# Patient Record
Sex: Male | Born: 1990 | Race: White | Hispanic: No | Marital: Married | Smoking: Never smoker
Health system: Southern US, Community
[De-identification: ages and names within clinical notes are randomized; demographics above are authoritative.]

## PROBLEM LIST (undated history)

## (undated) DIAGNOSIS — F909 Attention-deficit hyperactivity disorder, unspecified type: Secondary | ICD-10-CM

## (undated) HISTORY — PX: TYMPANOPLASTY: SHX33

## (undated) HISTORY — DX: Attention-deficit hyperactivity disorder, unspecified type: F90.9

---

## 2017-04-30 ENCOUNTER — Ambulatory Visit (INDEPENDENT_AMBULATORY_CARE_PROVIDER_SITE_OTHER): Payer: 59 | Admitting: Medical

## 2017-04-30 ENCOUNTER — Encounter: Payer: Self-pay | Admitting: Medical

## 2017-04-30 VITALS — BP 120/84 | HR 73 | Temp 98.3°F | Resp 16 | Ht 70.0 in | Wt 174.6 lb

## 2017-04-30 DIAGNOSIS — Z113 Encounter for screening for infections with a predominantly sexual mode of transmission: Secondary | ICD-10-CM | POA: Diagnosis not present

## 2017-04-30 DIAGNOSIS — Z0000 Encounter for general adult medical examination without abnormal findings: Secondary | ICD-10-CM

## 2017-04-30 DIAGNOSIS — Z0184 Encounter for antibody response examination: Secondary | ICD-10-CM

## 2017-04-30 LAB — POC URINALSYSI DIPSTICK (AUTOMATED)
BILIRUBIN UA: NEGATIVE
Blood, UA: NEGATIVE
Glucose, UA: NEGATIVE
Ketones, UA: NEGATIVE
LEUKOCYTES UA: NEGATIVE
NITRITE UA: NEGATIVE
PH UA: 6 (ref 5.0–8.0)
PROTEIN UA: NEGATIVE
Spec Grav, UA: >=1.03 — AB (ref 1.010–1.025)
UROBILINOGEN UA: NEGATIVE U/dL — AB

## 2017-04-30 LAB — COMPREHENSIVE METABOLIC PANEL
ALK PHOS: 60 U/L (ref 39–117)
ALT: 22 U/L (ref 0–53)
AST: 15 U/L (ref 0–37)
Albumin: 5 g/dL (ref 3.5–5.2)
BUN: 14 mg/dL (ref 6–23)
CO2: 30 mEq/L (ref 19–32)
Calcium: 9.8 mg/dL (ref 8.4–10.5)
Chloride: 104 mEq/L (ref 96–112)
Creatinine, Ser: 0.92 mg/dL (ref 0.40–1.50)
GFR: 105.65 mL/min (ref >60.00)
GLUCOSE: 103 mg/dL — AB (ref 70–99)
POTASSIUM: 3.7 meq/L (ref 3.5–5.1)
Sodium: 142 mEq/L (ref 135–145)
TOTAL PROTEIN: 7.5 g/dL (ref 6.0–8.3)
Total Bilirubin: 0.7 mg/dL (ref 0.2–1.2)

## 2017-04-30 LAB — CBC WITH DIFFERENTIAL/PLATELET
Basophils Absolute: 0 10*3/uL (ref 0.0–0.1)
Basophils Relative: 0.5 % (ref 0.0–3.0)
EOS PCT: 1 % (ref 0.0–5.0)
Eosinophils Absolute: 0.1 10*3/uL (ref 0.0–0.7)
HCT: 48.7 % (ref 39.0–52.0)
Hemoglobin: 16.4 g/dL (ref 13.0–17.0)
LYMPHS ABS: 1.6 10*3/uL (ref 0.7–4.0)
Lymphocytes Relative: 27.7 % (ref 12.0–46.0)
MCHC: 33.8 g/dL (ref 30.0–36.0)
MCV: 89.2 fl (ref 78.0–100.0)
MONOS PCT: 8.3 % (ref 3.0–12.0)
Monocytes Absolute: 0.5 10*3/uL (ref 0.1–1.0)
NEUTROS PCT: 62.5 % (ref 43.0–77.0)
Neutro Abs: 3.6 10*3/uL (ref 1.4–7.7)
Platelets: 322 10*3/uL (ref 150.0–400.0)
RBC: 5.46 Mil/uL (ref 4.22–5.81)
RDW: 13.1 % (ref 11.5–15.5)
WBC: 5.8 10*3/uL (ref 4.0–10.5)

## 2017-04-30 LAB — LIPID PANEL
CHOLESTEROL: 205 mg/dL — AB (ref 0–200)
HDL: 45.7 mg/dL (ref >39.00)
LDL Cholesterol: 144 mg/dL — ABNORMAL HIGH (ref 0–99)
NonHDL: 159.52
TRIGLYCERIDES: 79 mg/dL (ref 0.0–149.0)
Total CHOL/HDL Ratio: 4
VLDL: 15.8 mg/dL (ref 0.0–40.0)

## 2017-04-30 NOTE — Patient Instructions (Addendum)
For you wellness exam today I have ordered cbc, cmp, lipid panel, ua and hiv.  Varicella titer pending.  No vaccine given today.  Recommend exercise and healthy diet.  We will let you know lab results as they come in.  Follow up date appointment will be determined after lab review.   Keep form until labs ready.      Preventive Care 18-39 Years, Male Preventive care refers to lifestyle choices and visits with your health care provider that can promote health and wellness. What does preventive care include?  A yearly physical exam. This is also called an annual well check.  Dental exams once or twice a year.  Routine eye exams. Ask your health care provider how often you should have your eyes checked.  Personal lifestyle choices, including: ? Daily care of your teeth and gums. ? Regular physical activity. ? Eating a healthy diet. ? Avoiding tobacco and drug use. ? Limiting alcohol use. ? Practicing safe sex. What happens during an annual well check? The services and screenings done by your health care provider during your annual well check will depend on your age, overall health, lifestyle risk factors, and family history of disease. Counseling Your health care provider may ask you questions about your:  Alcohol use.  Tobacco use.  Drug use.  Emotional well-being.  Home and relationship well-being.  Sexual activity.  Eating habits.  Work and work Statistician.  Screening You may have the following tests or measurements:  Height, weight, and BMI.  Blood pressure.  Lipid and cholesterol levels. These may be checked every 5 years starting at age 76.  Diabetes screening. This is done by checking your blood sugar (glucose) after you have not eaten for a while (fasting).  Skin check.  Hepatitis C blood test.  Hepatitis B blood test.  Sexually transmitted disease (STD) testing.  Discuss your test results, treatment options, and if necessary, the need  for more tests with your health care provider. Vaccines Your health care provider may recommend certain vaccines, such as:  Influenza vaccine. This is recommended every year.  Tetanus, diphtheria, and acellular pertussis (Tdap, Td) vaccine. You may need a Td booster every 10 years.  Varicella vaccine. You may need this if you have not been vaccinated.  HPV vaccine. If you are 57 or younger, you may need three doses over 6 months.  Measles, mumps, and rubella (MMR) vaccine. You may need at least one dose of MMR.You may also need a second dose.  Pneumococcal 13-valent conjugate (PCV13) vaccine. You may need this if you have certain conditions and have not been vaccinated.  Pneumococcal polysaccharide (PPSV23) vaccine. You may need one or two doses if you smoke cigarettes or if you have certain conditions.  Meningococcal vaccine. One dose is recommended if you are age 85-21 years and a first-year college student living in a residence hall, or if you have one of several medical conditions. You may also need additional booster doses.  Hepatitis A vaccine. You may need this if you have certain conditions or if you travel or work in places where you may be exposed to hepatitis A.  Hepatitis B vaccine. You may need this if you have certain conditions or if you travel or work in places where you may be exposed to hepatitis B.  Haemophilus influenzae type b (Hib) vaccine. You may need this if you have certain risk factors.  Talk to your health care provider about which screenings and vaccines you need and how  often you need them. This information is not intended to replace advice given to you by your health care provider. Make sure you discuss any questions you have with your health care provider. Document Released: 06/05/2001 Document Revised: 12/28/2015 Document Reviewed: 02/08/2015 Elsevier Interactive Patient Education  2018 [employer]. , lipid panel, ua and hiv.  Varicella titer.  No  vaccine given today.   Recommend exercise and healthy diet.  We will let you know lab results as they come in.  Follow up date appointment will be determined after lab review.   Holding form until all results in.

## 2017-04-30 NOTE — Progress Notes (Signed)
Subjective:    Patient ID: Francisco Herring, male    DOB: 1990-10-06, 27 y.o.   MRN: [id number]  HPI  Pt in for first time.  Pt states feels well.  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 Works both at [employer] and at [employer].  Pt does not work out regularly. 10,000-15,000 steps a day. Does portable xray. Pt states relatively healthy. Eats fruits and vegetables. Does not eat out a lot. No hobbies.   Pt had flu vaccine.  Pt last physical was in 2016.   Is fasting.  Pt has form for CT program he wants to start. They need varicella titer. Pt got chicken pox when he was 27 years old.  Pt states tdap was im 01-22-2008.     Review of Systems  Constitutional: Negative for chills, fatigue and fever.  HENT: Negative for congestion, ear discharge, ear pain, facial swelling, hearing loss, nosebleeds, postnasal drip, rhinorrhea, sinus pressure, sinus pain and sneezing.   Respiratory: Negative for cough, chest tightness, shortness of breath and wheezing.   Cardiovascular: Negative for chest pain and palpitations.  Gastrointestinal: Negative for abdominal distention, abdominal pain, diarrhea, nausea and vomiting.  Musculoskeletal: Negative for back pain.  Skin: Negative for rash.  Neurological: Negative for dizziness, weakness, numbness and headaches.  Hematological: Negative for adenopathy. Does not bruise/bleed easily.  Psychiatric/Behavioral: Negative for behavioral problems and confusion.   No past medical history on file.   Social History   Socioeconomic History  . Marital status: Single    Spouse name: Not on file  . Number of children: Not on file  . Years of education: Not on file  . Highest education level: Not on file  Social Needs  . Financial resource strain: Not on file  . Food insecurity - worry: Not on file  . Food insecurity - inability: Not on file  . Transportation needs - medical: Not on file  . Transportation needs - non-medical: Not on file  Occupational History  . Not  on file  Tobacco Use  . Smoking status: Never Smoker  . Smokeless tobacco: Never Used  Substance and Sexual Activity  . Alcohol use: Not on file  . Drug use: Not on file  . Sexual activity: Not on file  Other Topics Concern  . Not on file  Social History Narrative  . Not on file     No family history on file.  Allergies  Allergen Reactions  . Sulfa Antibiotics Hives    Current Outpatient Medications on File Prior to Visit  Medication Sig Dispense Refill  . Multiple Vitamin (MULTI-VITAMINS) TABS Take by mouth.    . Probiotic Product (PROBIOTIC-10 PO) Take by mouth.     No current facility-administered medications on file prior to visit.     BP 120/84   Pulse 73   Temp 98.3 F (36.8 C) (Oral)   Resp 16   Ht 5\' 10"  (1.778 m)   Wt 174 lb 9.6 oz (79.2 kg)   SpO2 100%   BMI 25.05 kg/m       Objective:   Physical Exam  General Mental Status- Alert. General Appearance- Not in acute distress.   Skin General: Color- Normal Color. Moisture- Normal Moisture.   Neck Carotid Arteries- Normal color. Moisture- Normal Moisture. No carotid bruits. No JVD.  Chest and Lung Exam Auscultation: Breath Sounds:-Normal.  Cardiovascular Auscultation:Rythm- Regular. Murmurs & Other Heart Sounds:Auscultation of the heart reveals- No Murmurs.  Abdomen Inspection:-Inspeection Normal. Palpation/Percussion:Note:No mass. Palpation and Percussion of the  abdomen reveal- Non Tender, Non Distended + BS, no rebound or guarding.  Schedule exam-deferred    Neurologic Cranial Nerve exam:- CN III-XII intact(No nystagmus), symmetric smile. Strength:- 5/5 equal and symmetric strength both upper and lower extremities.      Assessment & Plan:  For you wellness exam today I have ordered cbc, cmp, lipid panel, ua and hiv.  Varicella titer pending.  No vaccine given today.  Recommend exercise and healthy diet.  We will let you know lab results as they come in.  Follow up date  appointment will be determined after lab review.   Keep form until labs ready.

## 2017-05-01 LAB — HIV ANTIBODY (ROUTINE TESTING W REFLEX): HIV 1&2 Ab, 4th Generation: NONREACTIVE

## 2017-05-01 LAB — VARICELLA ZOSTER ANTIBODY, IGG: Varicella IgG: >4000 index

## 2017-05-02 ENCOUNTER — Telehealth: Payer: Self-pay | Admitting: *Deleted

## 2017-05-02 ENCOUNTER — Telehealth: Payer: Self-pay | Admitting: Medical

## 2017-05-02 NOTE — Telephone Encounter (Signed)
Copied from CRM #[id number]. Topic: Quick Communication - See Telephone Encounter >> May 02, 2017  2:56 PM Meeya Goldin M, NT wrote: CRM for notification. See Telephone encounter for:   05/02/17. Pt. Calling and letting practice know that he will be in Monday to pick up paper work 

## 2017-05-02 NOTE — Telephone Encounter (Signed)
Copied from CRM [id number]. Topic: Quick Communication - See Telephone Encounter >> May 02, 2017  2:56 PM Rudi CocoLathan, Carle Dargan M, NT wrote: CRM for notification. See Telephone encounter for:   05/02/17. Pt. Calling and letting practice know that he will be in Monday to pick up paper work

## 2017-05-03 NOTE — Telephone Encounter (Signed)
LMOM with contact name and number for return call RE: paperwork complete and would he prefer to p/u at front desk during regular business hours or have it mailed to him; awaiting return call/SLS 05/02/17  Rudi CocoLathan, Latoya M, NT    05/02/17 2:59 PM  Note    Copied from CRM [phone]. Topic: Quick Communication - See Telephone Encounter >> May 02, 2017  2:56 PM Rudi CocoLathan, Latoya M, NT wrote: CRM for notification. See Telephone encounter for:   05/02/17. Pt. Calling and letting practice know that he will be in Monday to pick up paper work      05/02/17 2:59 PM  Rudi CocoLathan, Latoya M, NT [facility]  Cindy HazyLathan, Latoya ShanikoM, [place]   05/02/17 2:59 PM  Note    Copied from CRM [phone]. Topic: Quick Communication - See Telephone Encounter >> May 02, 2017  2:56 PM Rudi CocoLathan, Latoya M, NT wrote: CRM for notification. See Telephone encounter for:   05/02/17. Pt. Calling and letting practice know that he will be in Monday to pick up paper work      05/02/17 2:58 PM    Helen HashimotoBachman, Vincent contacted Rudi CocoLathan, Latoya M, NT   05/02/17 2:56 PM    Helen HashimotoBachman, [place] contacted Rudi CocoLathan, Latoya M, NT

## 2018-07-02 ENCOUNTER — Ambulatory Visit (INDEPENDENT_AMBULATORY_CARE_PROVIDER_SITE_OTHER): Payer: 59 | Admitting: Medical

## 2018-07-02 ENCOUNTER — Encounter: Payer: Self-pay | Admitting: Medical

## 2018-07-02 ENCOUNTER — Other Ambulatory Visit: Payer: Self-pay

## 2018-07-02 VITALS — BP 127/83 | HR 72 | Resp 12 | Ht 70.0 in | Wt 164.0 lb

## 2018-07-02 DIAGNOSIS — Z113 Encounter for screening for infections with a predominantly sexual mode of transmission: Secondary | ICD-10-CM

## 2018-07-02 DIAGNOSIS — Z0000 Encounter for general adult medical examination without abnormal findings: Secondary | ICD-10-CM | POA: Diagnosis not present

## 2018-07-02 DIAGNOSIS — R5383 Other fatigue: Secondary | ICD-10-CM | POA: Diagnosis not present

## 2018-07-02 LAB — COMPREHENSIVE METABOLIC PANEL
ALBUMIN: 4.8 g/dL (ref 3.5–5.2)
ALT: 16 U/L (ref 0–53)
AST: 13 U/L (ref 0–37)
Alkaline Phosphatase: 56 U/L (ref 39–117)
BILIRUBIN TOTAL: 1.3 mg/dL — AB (ref 0.2–1.2)
BUN: 11 mg/dL (ref 6–23)
CALCIUM: 9.9 mg/dL (ref 8.4–10.5)
CHLORIDE: 104 meq/L (ref 96–112)
CO2: 29 meq/L (ref 19–32)
CREATININE: 1.05 mg/dL (ref 0.40–1.50)
GFR: 84.58 mL/min (ref >60.00)
Glucose, Bld: 79 mg/dL (ref 70–99)
Potassium: 4.3 mEq/L (ref 3.5–5.1)
Sodium: 140 mEq/L (ref 135–145)
TOTAL PROTEIN: 6.8 g/dL (ref 6.0–8.3)

## 2018-07-02 LAB — CBC WITH DIFFERENTIAL/PLATELET
BASOS PCT: 0.6 % (ref 0.0–3.0)
Basophils Absolute: 0 10*3/uL (ref 0.0–0.1)
EOS PCT: 1.9 % (ref 0.0–5.0)
Eosinophils Absolute: 0.1 10*3/uL (ref 0.0–0.7)
HCT: 47.4 % (ref 39.0–52.0)
HEMOGLOBIN: 16.3 g/dL (ref 13.0–17.0)
LYMPHS PCT: 25.3 % (ref 12.0–46.0)
Lymphs Abs: 1.7 10*3/uL (ref 0.7–4.0)
MCHC: 34.3 g/dL (ref 30.0–36.0)
MCV: 89.5 fl (ref 78.0–100.0)
Monocytes Absolute: 0.6 10*3/uL (ref 0.1–1.0)
Monocytes Relative: 8.6 % (ref 3.0–12.0)
Neutro Abs: 4.2 10*3/uL (ref 1.4–7.7)
Neutrophils Relative %: 63.6 % (ref 43.0–77.0)
Platelets: 257 10*3/uL (ref 150.0–400.0)
RBC: 5.3 Mil/uL (ref 4.22–5.81)
RDW: 14.1 % (ref 11.5–15.5)
WBC: 6.6 10*3/uL (ref 4.0–10.5)

## 2018-07-02 LAB — LIPID PANEL
CHOLESTEROL: 147 mg/dL (ref 0–200)
HDL: 45 mg/dL (ref >39.00)
LDL Cholesterol: 87 mg/dL (ref 0–99)
NonHDL: 101.95
TRIGLYCERIDES: 77 mg/dL (ref 0.0–149.0)
Total CHOL/HDL Ratio: 3
VLDL: 15.4 mg/dL (ref 0.0–40.0)

## 2018-07-02 LAB — TSH: TSH: 1.13 u[IU]/mL (ref 0.35–4.50)

## 2018-07-02 LAB — VITAMIN B12: VITAMIN B 12: 472 pg/mL (ref 211–911)

## 2018-07-02 NOTE — Patient Instructions (Addendum)
For you wellness exam today I have ordered cbc, cmp, lipid panel,  and hiv.(added tsh, b12, b1 and vit D.  Vaccines up to date.  Recommend exercise and healthy diet.  We will let you know lab results as they come in.  Follow up date appointment will be determined after lab review.   For fatigue if all tests are negative then might consider getting testosterone levels as you brought this up as concern.  If all test come back negative might consider giving Wellbutrin might be option with your ADD hx. Might give more energy and improve your concentration. Might increase mood as well though your depression score screening was low.   Preventive Care 18-39 Years, Male Preventive care refers to lifestyle choices and visits with your health care provider that can promote health and wellness. What does preventive care include?   A yearly physical exam. This is also called an annual well check.  Dental exams once or twice a year.  Routine eye exams. Ask your health care provider how often you should have your eyes checked.  Personal lifestyle choices, including: ? Daily care of your teeth and gums. ? Regular physical activity. ? Eating a healthy diet. ? Avoiding tobacco and drug use. ? Limiting alcohol use. ? Practicing safe sex. What happens during an annual well check? The services and screenings done by your health care provider during your annual well check will depend on your age, overall health, lifestyle risk factors, and family history of disease. Counseling Your health care provider may ask you questions about your:  Alcohol use.  Tobacco use.  Drug use.  Emotional well-being.  Home and relationship well-being.  Sexual activity.  Eating habits.  Work and work Statistician. Screening You may have the following tests or measurements:  Height, weight, and BMI.  Blood pressure.  Lipid and cholesterol levels. These may be checked every 5 years starting at age  28.  Diabetes screening. This is done by checking your blood sugar (glucose) after you have not eaten for a while (fasting).  Skin check.  Hepatitis C blood test.  Hepatitis B blood test.  Sexually transmitted disease (STD) testing. Discuss your test results, treatment options, and if necessary, the need for more tests with your health care provider. Vaccines Your health care provider may recommend certain vaccines, such as:  Influenza vaccine. This is recommended every year.  Tetanus, diphtheria, and acellular pertussis (Tdap, Td) vaccine. You may need a Td booster every 10 years.  Varicella vaccine. You may need this if you have not been vaccinated.  HPV vaccine. If you are 25 or younger, you may need three doses over 6 months.  Measles, mumps, and rubella (MMR) vaccine. You may need at least one dose of MMR.You may also need a second dose.  Pneumococcal 13-valent conjugate (PCV13) vaccine. You may need this if you have certain conditions and have not been vaccinated.  Pneumococcal polysaccharide (PPSV23) vaccine. You may need one or two doses if you smoke cigarettes or if you have certain conditions.  Meningococcal vaccine. One dose is recommended if you are age 28-21 years years and a first-year college student living in a residence hall, or if you have one of several medical conditions. You may also need additional booster doses.  Hepatitis A vaccine. You may need this if you have certain conditions or if you travel or work in places where you may be exposed to hepatitis A.  Hepatitis B vaccine. You may need this if you have  certain conditions or if you travel or work in places where you may be exposed to hepatitis B.  Haemophilus influenzae type b (Hib) vaccine. You may need this if you have certain risk factors. Talk to your health care provider about which screenings and vaccines you need and how often you need them. This information is not intended to replace advice given to  you by your health care provider. Make sure you discuss any questions you have with your health care provider. Document Released: 06/05/2001 Document Revised: 11/20/2016 Document Reviewed: 02/08/2015 Elsevier Interactive Patient Education  2019 [employer].

## 2018-07-02 NOTE — Progress Notes (Signed)
Subjective:    Patient ID: Francisco Herring, male    DOB: [birth year], 28 y.o.   MRN: [id number]  HPI  Pt in for first time.  Pt states feels well.  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  Pt does not work out regularly. 10,000-15,000 steps a day. Does portable xray. Pt states relatively healthy. Eats fruits and vegetables. Does not eat out a lot. No hobbies.   Pt had flu vaccine.   Is fasting.  Pt has fatigue complaint and states depression as well. See ROS.    Review of Systems  Constitutional: Positive for fatigue. Negative for chills and fever.       He states sleeps 8 hours a day at least. Will take a nap also and still feels tired easily.   Works 2:30 pm-11 pm.  HENT: Negative for congestion, ear discharge, ear pain, hearing loss, mouth sores and nosebleeds.   Respiratory: Negative for cough, chest tightness, shortness of breath and wheezing.   Cardiovascular: Negative for chest pain and palpitations.  Gastrointestinal: Negative for abdominal distention, abdominal pain, blood in stool and constipation.  Genitourinary: Negative for dysuria, flank pain and frequency.       He reports some decreased libido.  Musculoskeletal: Negative for back pain, myalgias and neck stiffness.  Skin: Negative for pallor and rash.  Neurological: Negative for dizziness, speech difficulty, weakness, numbness and headaches.  Hematological: Negative for adenopathy. Does not bruise/bleed easily.  Psychiatric/Behavioral: Positive for dysphoric mood. Negative for agitation, behavioral problems, decreased concentration, sleep disturbance and suicidal ideas. The patient is not nervous/anxious.        Pt has friends who thinks he is depressed.  Some decreased appetite.  Pt has been on antidepressant before. On Wellbutrin to treat ADD.    History reviewed. No pertinent past medical history.   Social History   Socioeconomic History  . Marital status: Single   Spouse name: Not on file  . Number of children: Not on file  . Years of education: Not on file  . Highest education level: Not on file  Occupational History  . Not on file  Social Needs  . Financial resource strain: Not on file  . Food insecurity:    Worry: Not on file    Inability: Not on file  . Transportation needs:    Medical: Not on file    Non-medical: Not on file  Tobacco Use  . Smoking status: Never Smoker  . Smokeless tobacco: Never Used  Substance and Sexual Activity  . Alcohol use: Not on file  . Drug use: No  . Sexual activity: Not on file  Lifestyle  . Physical activity:    Days per week: Not on file    Minutes per session: Not on file  . Stress: Not on file  Relationships  . Social connections:    Talks on phone: Not on file    Gets together: Not on file    Attends religious service: Not on file    Active member of club or organization: Not on file    Attends meetings of clubs or organizations: Not on file    Relationship status: Not on file  . Intimate partner violence:    Fear of current or ex partner: Not on file    Emotionally abused: Not on file    Physically abused: Not on file    Forced sexual activity: Not on file  Other Topics Concern  . Not  on file  Social History Narrative  . Not on file    History reviewed. No pertinent surgical history.  History reviewed. No pertinent family history.  Allergies  Allergen Reactions  . Sulfa Antibiotics Hives    Current Outpatient Medications on File Prior to Visit  Medication Sig Dispense Refill  . Multiple Vitamin (MULTI-VITAMINS) TABS Take by mouth.    . Probiotic Product (PROBIOTIC-10 PO) Take by mouth.     No current facility-administered medications on file prior to visit.     BP 127/83 (BP Location: Left Arm, Patient Position: Sitting, Cuff Size: Normal)   Pulse 72   Resp 12   Ht 5\' 10"  (1.778 m)   Wt 164 lb (74.4 kg)   SpO2 97%   BMI 23.53 kg/m        Objective:   Physical  Exam  General Mental Status- Alert. General Appearance- Not in acute distress.   Skin General: Color- Normal Color. Moisture- Normal Moisture.  Neck Carotid Arteries- Normal color. Moisture- Normal Moisture. No carotid bruits. No JVD.  Chest and Lung Exam Auscultation: Breath Sounds:-Normal.  Cardiovascular Auscultation:Rythm- Regular. Murmurs & Other Heart Sounds:Auscultation of the heart reveals- No Murmurs.  Abdomen Inspection:-Inspeection Normal. Palpation/Percussion:Note:No mass. Palpation and Percussion of the abdomen reveal- Non Tender, Non Distended + BS, no rebound or guarding.   Neurologic Cranial Nerve exam:- CN III-XII intact(No nystagmus), symmetric smile. Strength:- 5/5 equal and symmetric strength both upper and lower extremities.      Assessment & Plan:  For you wellness exam today I have ordered cbc, cmp, lipid panel,  and hiv.(added tsh, b12, b1 and vit D.  Vaccines up to date.  Recommend exercise and healthy diet.  We will let you know lab results as they come in.  Follow up date appointment will be determined after lab review.   For fatigue if all tests are negative then might consider getting testosterone levels as you brought this up as concern.  If all test come back negative might consider giving Wellbutrin might be option with your ADD hx. Might give more energy and improve your concentration. Might increase mood as well though your depression score screening was low.  Esperanza Richters, [place]   [id number] was added to physical as we discussed and worked up fatigue, and possible depression. Discussed possible low T as well. Explained work up going forward and potential treatment plan in addition to wellness.

## 2018-07-03 ENCOUNTER — Encounter: Payer: Self-pay | Admitting: Medical

## 2018-07-03 ENCOUNTER — Telehealth: Payer: Self-pay | Admitting: Medical

## 2018-07-03 MED ORDER — BUPROPION HCL ER (XL) 150 MG PO TB24: 150.0000 mg | ORAL_TABLET | Freq: Every day | ORAL | 1 refills | Status: DC

## 2018-07-03 NOTE — Telephone Encounter (Signed)
Rx wellbutrin sent to pharmacy. Will you notify [facility]. If he wants to use other pharmacy. Then cancel [facility] and resend to pharmacy of choice.

## 2018-07-04 MED FILL — buPROPion HCL ER (XL) 150 M: 150 | 30 days supply | Qty: 30 | Fill #0 | Status: TO

## 2018-07-07 LAB — VITAMIN D 1,25 DIHYDROXY
VITAMIN D3 1, 25 (OH): 47 pg/mL
Vitamin D 1, 25 (OH)2 Total: 47 pg/mL (ref 18–72)
Vitamin D2 1, 25 (OH)2: <8 pg/mL

## 2018-07-07 LAB — HIV ANTIBODY (ROUTINE TESTING W REFLEX): HIV 1&2 Ab, 4th Generation: NONREACTIVE

## 2018-07-07 LAB — VITAMIN B1: Vitamin B1 (Thiamine): 8 nmol/L (ref 8–30)

## 2018-07-24 ENCOUNTER — Encounter: Payer: Self-pay | Admitting: Medical

## 2018-07-24 MED FILL — buPROPion HCL ER (XL) 150 M: 150 | 30 days supply | Qty: 30 | Fill #0

## 2018-08-18 ENCOUNTER — Other Ambulatory Visit: Payer: Self-pay | Admitting: Medical

## 2018-08-19 MED FILL — buPROPion HCL ER (XL) 150 M: 150 | 30 days supply | Qty: 30 | Fill #0

## 2018-08-21 ENCOUNTER — Other Ambulatory Visit: Payer: Self-pay

## 2018-08-21 ENCOUNTER — Ambulatory Visit (INDEPENDENT_AMBULATORY_CARE_PROVIDER_SITE_OTHER): Payer: 59 | Admitting: Medical

## 2018-08-21 ENCOUNTER — Telehealth: Payer: Self-pay | Admitting: Medical

## 2018-08-21 ENCOUNTER — Encounter: Payer: Self-pay | Admitting: Medical

## 2018-08-21 DIAGNOSIS — R41840 Attention and concentration deficit: Secondary | ICD-10-CM | POA: Diagnosis not present

## 2018-08-21 DIAGNOSIS — F988 Other specified behavioral and emotional disorders with onset usually occurring in childhood and adolescence: Secondary | ICD-10-CM | POA: Diagnosis not present

## 2018-08-21 MED ORDER — BUPROPION HCL ER (XL) 150 MG PO TB24: 150.0000 mg | ORAL_TABLET | Freq: Every day | ORAL | 5 refills | Status: DC

## 2018-08-21 NOTE — Patient Instructions (Signed)
History of ADD as youth and uses Wellbutrin for concentration. He finds this has helped a lot and reports no side effects. Will rx new script with refills.  Pt works with covid patients taking xrays. He feels fine and reports no covid like symptoms. He works for [employer] so will get flu vaccine before season.  Follow up in 6-7 months for ADD/poor concentration or sooner if needed

## 2018-08-21 NOTE — Telephone Encounter (Signed)
LVM for pt to call and schedule 6 month follow up

## 2018-08-21 NOTE — Progress Notes (Signed)
   Subjective:    Patient ID: Francisco Herring, male    DOB: 1990-07-14, 28 y.o.   MRN: [id number]  HPI  Virtual Visit via Video Note  I connected with Helen Hashimoto on 08/21/18 at  8:20 AM EDT by a video enabled telemedicine application and verified that I am speaking with the correct person using two identifiers.  Location: Patient: home Provider: office  Pt did not check pulse or bp.   I discussed the limitations of evaluation and management by telemedicine and the availability of in person appointments. The patient expressed understanding and agreed to proceed.    History of Present Illness:  Pt update me working with covid. Pt feels well. No symptoms. Pt working 12 hour shifts 3 days a week. Pt has been exercising.   Pt was using wellbutrin for concentration and possible. Also seems to help his energy.  Pt not smoker. Pt mood is controlled.  History of ADD as youth.  He states wellbutrin has helped a lot with no side effects.       Observations/Objective: No acute distress. Good mood. Normal affect. Neuro- gross motor function appears in take. Lungs- on inspection. No labored breathing.    Assessment and Plan: History of ADD as youth and uses Wellbutrin for concentration. He finds this has helped a lot and reports no side effects. Will rx new script with refills.  Pt works with covid patients taking xrays. He feels fine and reports no covid like symptoms. He works for [employer] so will get flu vaccine before season.  Follow up in 6-7 months for ADD/poor concentration or sooner if needed  Follow Up Instructions:    I discussed the assessment and treatment plan with the patient. The patient was provided an opportunity to ask questions and all were answered. The patient agreed with the plan and demonstrated an understanding of the instructions.   The patient was advised to call back or seek an in-person evaluation if the symptoms worsen or if the condition fails to  improve as anticipated.     Esperanza Richters, PA-C    Review of Systems  Constitutional: Negative for chills, fatigue and fever.  Respiratory: Negative for cough, choking, wheezing and stridor.   Cardiovascular: Negative for chest pain and palpitations.  Gastrointestinal: Negative for abdominal pain.  Musculoskeletal: Negative for back pain.  Neurological: Negative for dizziness and headaches.  Hematological: Negative for adenopathy. Does not bruise/bleed easily.  Psychiatric/Behavioral: Positive for decreased concentration. Negative for behavioral problems, confusion, dysphoric mood and suicidal ideas.       Wellbutrin helps with concentration.       Objective:   Physical Exam        Assessment & Plan:

## 2018-09-12 MED FILL — buPROPion HCL ER (XL) 150 M: 150 | 30 days supply | Qty: 30 | Fill #0

## 2018-10-06 MED FILL — buPROPion HCL ER (XL) 150 M: 150 | 30 days supply | Qty: 30 | Fill #1

## 2018-10-17 ENCOUNTER — Ambulatory Visit: Payer: Self-pay | Admitting: Nurse Practitioner

## 2018-10-17 ENCOUNTER — Encounter: Payer: Self-pay | Admitting: Nurse Practitioner

## 2018-10-17 ENCOUNTER — Other Ambulatory Visit: Payer: Self-pay

## 2018-10-17 VITALS — BP 110/84 | HR 98 | Temp 98.5°F | Resp 16 | Wt 153.6 lb

## 2018-10-17 DIAGNOSIS — H6123 Impacted cerumen, bilateral: Secondary | ICD-10-CM

## 2018-10-17 NOTE — Progress Notes (Signed)
MRN: [id number] DOB: March 12, 1991  Subjective:   Francisco HashimotoMichael Herring is a 28 y.o. male presenting for chief complaint of left ear clogged .  Reports a 2 day history of ear fullness, diminished hearing and "feeling like ear is "clogged". Has tried ear wax softner  for relief. Denies fever, subjective fever, sinus headache, sinus congestion , sinus pain, rhinorrhea, ear drainage, sore throat, difficulty swallowing, pain with swallowing, inability to swallow, dry cough, productive cough, wheezing, shortness of breath, chest tightness, chest pain and myalgia, subjective fever, night sweats, chills, fatigue, malaise, decreased appetite, weight loss, nausea, vomiting, abdominal pain and diarrhea. 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  Patient informs his last OM was around the 8th grade.  Patient informs he has a recurrent problem with cerumen impaction.  He informs his ears were last impacted in 2016.  Has been using ear wax softener regularly since that time. Has not had sick contact with influenza, strep or COVID-19. Admits to history of seasonal allergies, denies history of asthma. Denies smoking. Denies recent travel. Denies any other aggravating or relieving factors, no other questions or concerns.  Review of Systems  Constitutional: Negative.   HENT: Positive for hearing loss (diminished in left ear). Negative for congestion, ear discharge, ear pain, nosebleeds, sinus pain, sore throat and tinnitus.        See HPI.  Eyes: Negative.   Respiratory: Negative.  Negative for stridor.   Cardiovascular: Negative.   Skin: Negative.   Neurological: Negative.   Endo/Heme/Allergies: Positive for environmental allergies.  Psychiatric/Behavioral: Negative.     Francisco Herring has a current medication list which includes the following prescription(s): bupropion, multi-vitamins, and probiotic product. Also is allergic to sulfa antibiotics.  Francisco Herring  has no past medical history on file. Also  has  no past surgical history on file.   Objective:   Vitals: BP 110/84   Pulse 98   Temp 98.5 F (36.9 C)   Resp 16   Wt 153 lb 9.6 oz (69.7 kg)   SpO2 98%   BMI 22.04 kg/m   Physical Exam Vitals signs reviewed.  Constitutional:      General: He is not in acute distress. HENT:     Head: Normocephalic.     Right Ear: Hearing and external ear normal. No drainage or tenderness. There is impacted cerumen.     Left Ear: External ear normal. No drainage or tenderness. There is impacted cerumen.     Nose: Nose normal. No congestion or rhinorrhea.     Mouth/Throat:     Mouth: Mucous membranes are moist.     Pharynx: No oropharyngeal exudate or posterior oropharyngeal erythema.  Eyes:     Conjunctiva/sclera: Conjunctivae normal.     Pupils: Pupils are equal, round, and reactive to light.  Neck:     Musculoskeletal: Normal range of motion.  Cardiovascular:     Rate and Rhythm: Normal rate and regular rhythm.     Pulses: Normal pulses.     Heart sounds: Normal heart sounds.  Pulmonary:     Effort: Pulmonary effort is normal.     Breath sounds: Normal breath sounds.  Abdominal:     General: Bowel sounds are normal.     Palpations: Abdomen is soft.     Tenderness: There is no abdominal tenderness.  Lymphadenopathy:     Cervical: No cervical adenopathy.  Skin:    General: Skin is warm and dry.  Neurological:     General: No focal deficit  present.     Mental Status: He is alert and oriented to person, place, and time.  Psychiatric:        Mood and Affect: Mood normal.        Thought Content: Thought content normal.   Ceruminosis is noted.  Wax is removed by gentle irrigation using peroxide and colace solution.  Patient tolerated well.  Mild redness to right ear canal, patient does not exhibit auricle or pinna tenderness bilaterally, no complaints of pain bilaterally. No erythema to left ear canal.    Assessment and Plan :  Exam findings, diagnosis etiology and medication use  and indications reviewed with patient. Follow- Up and discharge instructions provided. No emergent/urgent issues found on exam.  Post irrigation, patient noted significant improvement in the hearing of the left ear. Right ear canal with mild erythema, but do not believe this is an otitis externa as patient did not have external ear pain, itching or tenderness to pinna or auricle.  Feeling of fullness/clogged directly attributed to the significant amount of ear wax removed.  Informed patient that if he develops pain to the right ear to contact our office and we will provide ear drops for him.  I would consider Ofloxacin otic drops at that time.  Instructions provided to the patient for home care to include use of over-the-counter earwax softener, and instructions on how to perform ear irrigation at home.  Discussed with the patient that he can also return to our office as needed for ear irrigation.  There was patient education was provided. Patient verbalized understanding of information provided and agrees with plan of care (POC), all questions answered. The patient is advised to call or return to clinic if conditiondoes not see an improvement in symptoms, or to seek the care of the closest emergency department if conditionworsens with the above plan.   1. Bilateral impacted cerumen  -Continue to use ear wax softener 2-3 times per week. -May use peroxide solution to irrigate ears.  You should use normal saline or sterile water (purchase over-the-counter).  You can also return to our office for ear irrigations as needed. -Do not use Q-tips or objects such as hair pins to remove ear wax. -Occasionally, let warm water run into the ear canal to soften ear wax.  Do not do this repeatedly as this may result in an external ear infection. -Follow up with our office if you develop symptoms of OE in the right ear.  Will prescribe Ofloxacin otic drops if patient develops symptoms. -Follow up as needed.

## 2018-10-17 NOTE — Patient Instructions (Addendum)
Earwax Buildup, Adult -Continue to use ear wax softener 2-3 times per week. -May use peroxide solution to irrigate ears.  You should use normal saline or sterile water (purchase over-the-counter).  You can also return to our office for ear irrigations as needed. -Do not use Q-tips or objects such as hair pins to remove ear wax. -Occasionally, let warm water run into the ear canal to soften ear wax.  Do not do this repeatedly as this may result in an external ear infection. -Follow up as needed.  The ears produce a substance called earwax that helps keep bacteria out of the ear and protects the skin in the ear canal. Occasionally, earwax can build up in the ear and cause discomfort or hearing loss. What increases the risk? This condition is more likely to develop in people who:  Are male.  Are elderly.  Naturally produce more earwax.  Clean their ears often with cotton swabs.  Use earplugs often.  Use in-ear headphones often.  Wear hearing aids.  Have narrow ear canals.  Have earwax that is overly thick or sticky.  Have eczema.  Are dehydrated.  Have excess hair in the ear canal. What are the signs or symptoms? Symptoms of this condition include:  Reduced or muffled hearing.  A feeling of fullness in the ear or feeling that the ear is plugged.  Fluid coming from the ear.  Ear pain.  Ear itch.  Ringing in the ear.  Coughing.  An obvious piece of earwax that can be seen inside the ear canal. How is this diagnosed? This condition may be diagnosed based on:  Your symptoms.  Your medical history.  An ear exam. During the exam, your health care provider will look into your ear with an instrument called an otoscope. You may have tests, including a hearing test. How is this treated? This condition may be treated by:  Using ear drops to soften the earwax.  Having the earwax removed by a health care provider. The health care provider may: ? Flush the ear with  water. ? Use an instrument that has a loop on the end (curette). ? Use a suction device.  Surgery to remove the wax buildup. This may be done in severe cases. Follow these instructions at home:   Take over-the-counter and prescription medicines only as told by your health care provider.  Do not put any objects, including cotton swabs, into your ear. You can clean the opening of your ear canal with a washcloth or facial tissue.  Follow instructions from your health care provider about cleaning your ears. Do not over-clean your ears.  Drink enough fluid to keep your urine clear or pale yellow. This will help to thin the earwax.  Keep all follow-up visits as told by your health care provider. If earwax builds up in your ears often or if you use hearing aids, consider seeing your health care provider for routine, preventive ear cleanings. Ask your health care provider how often you should schedule your cleanings.  If you have hearing aids, clean them according to instructions from the manufacturer and your health care provider. Contact a health care provider if:  You have ear pain.  You develop a fever.  You have blood, pus, or other fluid coming from your ear.  You have hearing loss.  You have ringing in your ears that does not go away.  Your symptoms do not improve with treatment.  You feel like the room is spinning (vertigo). Summary  Earwax can build up in the ear and cause discomfort or hearing loss.  The most common symptoms of this condition include reduced or muffled hearing and a feeling of fullness in the ear or feeling that the ear is plugged.  This condition may be diagnosed based on your symptoms, your medical history, and an ear exam.  This condition may be treated by using ear drops to soften the earwax or by having the earwax removed by a health care provider.  Do not put any objects, including cotton swabs, into your ear. You can clean the opening of your ear  canal with a washcloth or facial tissue. This information is not intended to replace advice given to you by your health care provider. Make sure you discuss any questions you have with your health care provider. Document Released: 05/17/2004 Document Revised: 03/22/2017 Document Reviewed: 06/20/2016 Elsevier Patient Education  2020 [place] is ear irrigation? Ear irrigation is a procedure to wash dirt and wax out of your ear canal. This procedure is also called lavage. You may need ear irrigation if you are having trouble hearing because of a buildup of earwax. You may also have ear irrigation as part of the treatment for an ear infection. Getting wax and dirt out of your ear canal can help some medicines given as ear drops work better. How is ear irrigation performed? The procedure may vary among health care providers and hospitals. You may be given ear drops to put in your ear 15-20 minutes before irrigation. This helps loosen the wax. Then, a syringe containing water and a sterile salt solution (saline) can be gently inserted into the ear canal. The saline is used to flush out wax and other debris. Ear irrigation kits are also available to use at home. Ask your health care provider if this is an option for you. Use a home irrigation kit only as told by your health care provider. Read the package instructions carefully. Follow the directions for using the syringe. Use water that is room temperature. Do not do ear irrigation at home if you:  Have diabetes. Diabetes increases the risk of infection.  Have a hole or tear in your eardrum.  Have tubes in your ears. What are the risks of ear irrigation? Generally, this is a safe procedure. However, problems may occur, including:  Infection.  Pain.  Hearing loss.  Pushing water and debris into the eardrum. This can occur if there are holes in the eardrum.  Ear irrigation failing to work. How should I care for my  ears after having them irrigated? Cleaning   Clean the outside of your ear with a soft washcloth daily.  If told by your health care provider, use a few drops of baby oil, mineral oil, glycerin, hydrogen peroxide, or over-the-counter earwax softening drops.  Do not use cotton swabs to clean your ears. These can push wax down into the ear canal.  Do not put anything into your ears to try to remove wax. This includes ear candles. General Instructions  Take over-the-counter and prescription medicines only as told by your health care provider.  If you were prescribed an antibiotic medicine, use it as told by your health care provider. Do not stop using the antibiotic even if your condition improves.  Keep all follow-up visits as told by your health care provider. This is important.  Visit your health care provider at least once a year to have your ears and hearing checked. When  should I seek medical care? Seek medical care if:  Your hearing is not improving or is getting worse.  You have pain or redness in your ear.  You have fluid, blood, or pus coming out of your ear. This information is not intended to replace advice given to you by your health care provider. Make sure you discuss any questions you have with your health care provider. Document Released: 05/06/2015 Document Revised: 11/21/2016 Document Reviewed: 09/16/2014 Elsevier Interactive Patient Education  2019 [employer].

## 2018-10-20 ENCOUNTER — Telehealth: Payer: Self-pay

## 2018-10-20 NOTE — Telephone Encounter (Signed)
Patient states he is doing good.  

## 2018-11-05 MED FILL — buPROPion HCL ER (XL) 150 M: 150 | 30 days supply | Qty: 30 | Fill #2

## 2018-12-05 MED FILL — buPROPion HCL ER (XL) 150 M: 150 | 30 days supply | Qty: 30 | Fill #3

## 2019-01-04 MED FILL — buPROPion HCL ER (XL) 150 M: 150 | 30 days supply | Qty: 30 | Fill #4

## 2019-02-03 MED FILL — buPROPion HCL ER (XL) 150 M: 150 | 30 days supply | Qty: 30 | Fill #5

## 2019-03-05 ENCOUNTER — Other Ambulatory Visit: Payer: Self-pay | Admitting: Medical

## 2019-03-05 MED FILL — BUPROPION HCL XL 150 MG TAB: 150 | 30 days supply | Qty: 30 | Fill #0

## 2019-03-27 DIAGNOSIS — Z20828 Contact with and (suspected) exposure to other viral communicable diseases: Secondary | ICD-10-CM | POA: Diagnosis not present

## 2019-10-02 ENCOUNTER — Encounter: Payer: Self-pay | Admitting: Family Medicine

## 2019-10-02 ENCOUNTER — Ambulatory Visit (INDEPENDENT_AMBULATORY_CARE_PROVIDER_SITE_OTHER): Payer: No Typology Code available for payment source | Admitting: Family Medicine

## 2019-10-02 ENCOUNTER — Other Ambulatory Visit: Payer: Self-pay

## 2019-10-02 VITALS — BP 125/76 | HR 88 | Temp 97.5°F | Ht 69.0 in | Wt 186.2 lb

## 2019-10-02 DIAGNOSIS — Z0000 Encounter for general adult medical examination without abnormal findings: Secondary | ICD-10-CM

## 2019-10-02 DIAGNOSIS — Z7689 Persons encountering health services in other specified circumstances: Secondary | ICD-10-CM | POA: Insufficient documentation

## 2019-10-02 DIAGNOSIS — Z1329 Encounter for screening for other suspected endocrine disorder: Secondary | ICD-10-CM

## 2019-10-02 DIAGNOSIS — E785 Hyperlipidemia, unspecified: Secondary | ICD-10-CM | POA: Diagnosis not present

## 2019-10-02 DIAGNOSIS — Z23 Encounter for immunization: Secondary | ICD-10-CM

## 2019-10-02 DIAGNOSIS — M25532 Pain in left wrist: Secondary | ICD-10-CM | POA: Diagnosis not present

## 2019-10-02 NOTE — Assessment & Plan Note (Signed)
Left wrist pain x 2 months.  Reports was moving a patient on/off a CT scan table that weighed > 600lbs and has been having left wrist pain since.  Has been wearing a wrist brace that is providing some support but still has concerns of feeling like a tendon is irritated and popping/clicking over a bone when he makes certain hand movements.  Plan: 1. Left wrist x-ray ordered 2. Referral to orthopedics placed

## 2019-10-02 NOTE — Assessment & Plan Note (Signed)
New patient establishment to [facility] on 10/02/2019.  Previously followed with [facility] in [place], [place], [place] but recently relocated closer to [facility].

## 2019-10-02 NOTE — Patient Instructions (Signed)
As we discussed, have your labs drawn in the next 1-2 weeks and we will contact you with the results.  You can plan to complete your complete left wrist xray on the same day that you have your labs drawn.  Once we have the left wrist xray has been completed, will refer you to Orthopedics for the continued left wrist pain as they may need additional imaging.    A referral to Orthopedics has been placed today.  If you have not heard from the specialty office or our referral coordinator within 1 week, please let us know and we will follow up with the referral coordinator for an update.  We will plan to see you back in 6 month for your physical  You will receive a survey after today's visit either digitally by e-mail or paper by USPS mail. Your experiences and feedback matter to [place].  Please respond so we know how we are doing as we provide care for you.  Call us with any questions/concerns/needs.  It is my goal to be available to you for your health concerns.  Thanks for choosing me to be a partner in your healthcare needs!  Charlaine Dalton, FNP-C Family Nurse Practitioner [facility] Phone: [phone]

## 2019-10-02 NOTE — Progress Notes (Signed)
Subjective:    Patient ID: Francisco Herring, male    DOB: Sep 26, 1990, 29 y.o.   MRN: [id number]  Francisco Herring is a 29 y.o. male presenting on 10/02/2019 for Establish Care   HPI  Previous PCP was at [facility].  Records will not be requested .  Past medical, family, and surgical history reviewed w/ pt.  Francisco Herring has acute concerns over left wrist pain.  Reports that approximately 2 months ago he was moving a patient that weighed > 600lbs on/off the CT table and has been having left wrist pain since.  Has been wearing a wrist brace for comfort.  Has not had any imaging or follow up with an orthopedic provider.  Depression screen [facility] 2/9 10/02/2019 07/02/2018 04/30/2017  Decreased Interest 0 1 0  Down, Depressed, Hopeless 0 0 0  PHQ - 2 Score 0 1 0    Social History   Tobacco Use  . Smoking status: Never Smoker  . Smokeless tobacco: Never Used  Vaping Use  . Vaping Use: Never used  Substance Use Topics  . Alcohol use: Never  . Drug use: No    Review of Systems  Constitutional: Negative.   HENT: Negative.   Eyes: Negative.   Respiratory: Negative.   Cardiovascular: Negative.   Gastrointestinal: Negative.   Endocrine: Negative.   Genitourinary: Negative.   Musculoskeletal: Positive for arthralgias. Negative for back pain, gait problem, joint swelling, myalgias, neck pain and neck stiffness.  Skin: Negative.   Allergic/Immunologic: Negative.   Neurological: Negative.   Hematological: Negative.   Psychiatric/Behavioral: Negative.    Per HPI unless specifically indicated above     Objective:    BP 125/76 (BP Location: Right Arm, Patient Position: Sitting, Cuff Size: Normal)   Pulse 88   Temp (!) 97.5 F (36.4 C) (Temporal)   Ht '5\' 9"'  (1.753 m)   Wt 186 lb 3.2 oz (84.5 kg)   BMI 27.50 kg/m   Wt Readings from Last 3 Encounters:  10/02/19 186 lb 3.2 oz (84.5 kg)  10/17/18 153 lb 9.6 oz (69.7 kg)  07/02/18 164 lb (74.4 kg)    Physical Exam Vitals reviewed.   Constitutional:      General: He is not in acute distress.    Appearance: Normal appearance. He is well-developed, well-groomed and overweight. He is not ill-appearing or toxic-appearing.  HENT:     Head: Normocephalic.     Nose:     Comments: Francisco Herring is in place, covering mouth and nose  Eyes:     General: Lids are normal. Vision grossly intact.        Right eye: No discharge.        Left eye: No discharge.     Extraocular Movements: Extraocular movements intact.     Conjunctiva/sclera: Conjunctivae normal.     Pupils: Pupils are equal, round, and reactive to light.  Cardiovascular:     Rate and Rhythm: Normal rate and regular rhythm.     Pulses: Normal pulses.     Heart sounds: Normal heart sounds. No murmur heard.  No friction rub. No gallop.   Pulmonary:     Effort: Pulmonary effort is normal. No respiratory distress.     Breath sounds: Normal breath sounds.  Musculoskeletal:     Comments: Left wrist with brace.  Reported feeling tendon sliding/clicking over bone with movement. <2 sec cap refill, no color changes  Skin:    General: Skin is warm and dry.     Capillary Refill:  Capillary refill takes less than 2 seconds.  Neurological:     General: No focal deficit present.     Mental Status: He is alert and oriented to person, place, and time.     Cranial Nerves: No cranial nerve deficit.     Sensory: No sensory deficit.     Motor: No weakness.     Coordination: Coordination normal.     Gait: Gait normal.  Psychiatric:        Attention and Perception: Attention and perception normal.        Mood and Affect: Mood and affect normal.        Speech: Speech normal.        Behavior: Behavior normal. Behavior is cooperative.        Thought Content: Thought content normal.        Cognition and Memory: Cognition and memory normal.        Judgment: Judgment normal.     Results for orders placed or performed in visit on 07/02/18  CBC w/Diff  Result Value Ref Range   WBC  6.6 4.0 - 10.5 K/uL   RBC 5.30 4.22 - 5.81 Mil/uL   Hemoglobin 16.3 13.0 - 17.0 g/dL   HCT 47.4 39 - 52 %   MCV 89.5 78.0 - 100.0 fl   MCHC 34.3 30.0 - 36.0 g/dL   RDW 14.1 11.5 - 15.5 %   Platelets 257.0 150 - 400 K/uL   Neutrophils Relative % 63.6 43 - 77 %   Lymphocytes Relative 25.3 12 - 46 %   Monocytes Relative 8.6 3 - 12 %   Eosinophils Relative 1.9 0 - 5 %   Basophils Relative 0.6 0 - 3 %   Neutro Abs 4.2 1.4 - 7.7 K/uL   Lymphs Abs 1.7 0.7 - 4.0 K/uL   Monocytes Absolute 0.6 0 - 1 K/uL   Eosinophils Absolute 0.1 0 - 0 K/uL   Basophils Absolute 0.0 0 - 0 K/uL  Comp Met (CMET)  Result Value Ref Range   Sodium 140 135 - 145 mEq/L   Potassium 4.3 3.5 - 5.1 mEq/L   Chloride 104 96 - 112 mEq/L   CO2 29 19 - 32 mEq/L   Glucose, Bld 79 70 - 99 mg/dL   BUN 11 6 - 23 mg/dL   Creatinine, Ser 1.05 0.40 - 1.50 mg/dL   Total Bilirubin 1.3 (H) 0.2 - 1.2 mg/dL   Alkaline Phosphatase 56 39 - 117 U/L   AST 13 0 - 37 U/L   ALT 16 0 - 53 U/L   Total Protein 6.8 6.0 - 8.3 g/dL   Albumin 4.8 3.5 - 5.2 g/dL   Calcium 9.9 8.4 - 10.5 mg/dL   GFR 84.58 >60.00 mL/min  Lipid panel  Result Value Ref Range   Cholesterol 147 0 - 200 mg/dL   Triglycerides 77.0 0 - 149 mg/dL   HDL 45.00 >39.00 mg/dL   VLDL 15.4 0.0 - 40.0 mg/dL   LDL Cholesterol 87 0 - 99 mg/dL   Total CHOL/HDL Ratio 3    NonHDL 101.95   B12  Result Value Ref Range   Vitamin B-12 472 211 - 911 pg/mL  Vitamin B1  Result Value Ref Range   Vitamin B1 (Thiamine) 8 8 - 30 nmol/L  Vitamin D 1,25 dihydroxy  Result Value Ref Range   Vitamin D 1, 25 (OH)2 Total 47 18 - 72 pg/mL   Vitamin D3 1, 25 (OH)2 47 pg/mL  Vitamin D2 1, 25 (OH)2 <8 pg/mL  TSH  Result Value Ref Range   TSH 1.13 0.35 - 4.50 uIU/mL  HIV Antibody (routine testing w rflx)  Result Value Ref Range   HIV 1&2 Ab, 4th Generation NON-REACTIVE NON-REACTI      Assessment & Plan:   Problem List Items Addressed This Visit      Other   Encounter to  establish care with new doctor    New patient establishment to [facility] on 10/02/2019.  Previously followed with [facility] in [place], [place], [place] but recently relocated closer to [facility].        Left wrist pain - Primary    Left wrist pain x 2 months.  Reports was moving a patient on/off a CT scan table that weighed > 600lbs and has been having left wrist pain since.  Has been wearing a wrist brace that is providing some support but still has concerns of feeling like a tendon is irritated and popping/clicking over a bone when he makes certain hand movements.  Plan: 1. Left wrist x-ray ordered 2. Referral to orthopedics placed      Relevant Orders   DG Wrist Complete Left    Other Visit Diagnoses    Immunization due       Relevant Orders   Tdap vaccine greater than or equal to 7yo IM (Completed)   Routine medical exam       Relevant Orders   CBC with Differential   COMPLETE METABOLIC PANEL WITH GFR   Hyperlipidemia, unspecified hyperlipidemia type       Relevant Orders   Lipid Profile   Screening for thyroid disorder       Relevant Orders   Thyroid Panel With TSH      No orders of the defined types were placed in this encounter.     Follow up plan: Return in about 6 months (around 04/02/2020) for CPE.   Harlin Rain, [place] Family Nurse Practitioner [facility] 10/02/2019, 11:01 AM

## 2019-10-07 ENCOUNTER — Ambulatory Visit
Admission: RE | Admit: 2019-10-07 | Discharge: 2019-10-07 | Disposition: A | Discharge status: Home / Self Care | Payer: No Typology Code available for payment source | Source: Ambulatory Visit | Attending: Family Medicine | Admitting: Family Medicine

## 2019-10-07 ENCOUNTER — Other Ambulatory Visit: Payer: No Typology Code available for payment source

## 2019-10-07 ENCOUNTER — Other Ambulatory Visit: Payer: Self-pay | Admitting: Family Medicine

## 2019-10-07 ENCOUNTER — Ambulatory Visit
Admission: RE | Admit: 2019-10-07 | Discharge: 2019-10-07 | Disposition: A | Discharge status: Home / Self Care | Payer: No Typology Code available for payment source | Attending: Family Medicine | Admitting: Family Medicine

## 2019-10-07 ENCOUNTER — Other Ambulatory Visit: Payer: Self-pay

## 2019-10-07 DIAGNOSIS — M25532 Pain in left wrist: Secondary | ICD-10-CM | POA: Insufficient documentation

## 2019-10-08 ENCOUNTER — Other Ambulatory Visit: Payer: Self-pay | Admitting: Family Medicine

## 2019-10-08 ENCOUNTER — Encounter: Payer: Self-pay | Admitting: Family Medicine

## 2019-10-08 DIAGNOSIS — R7989 Other specified abnormal findings of blood chemistry: Secondary | ICD-10-CM

## 2019-10-09 LAB — LIPID PANEL
Cholesterol: 217 mg/dL — ABNORMAL HIGH (ref <200)
HDL: 43 mg/dL (ref >=40)
LDL Cholesterol (Calc): 155 mg/dL (calc) — ABNORMAL HIGH
Non-HDL Cholesterol (Calc): 174 mg/dL (calc) — ABNORMAL HIGH (ref <130)
Total CHOL/HDL Ratio: 5 (calc) — ABNORMAL HIGH (ref <5.0)
Triglycerides: 88 mg/dL (ref <150)

## 2019-10-09 LAB — CBC WITH DIFFERENTIAL/PLATELET
Absolute Monocytes: 542 cells/uL (ref 200–950)
Basophils Absolute: 50 cells/uL (ref 0–200)
Basophils Relative: 0.8 %
Eosinophils Absolute: 107 cells/uL (ref 15–500)
Eosinophils Relative: 1.7 %
HCT: 47.4 % (ref 38.5–50.0)
Hemoglobin: 16.3 g/dL (ref 13.2–17.1)
Lymphs Abs: 2564 cells/uL (ref 850–3900)
MCH: 29.9 pg (ref 27.0–33.0)
MCHC: 34.4 g/dL (ref 32.0–36.0)
MCV: 87 fL (ref 80.0–100.0)
MPV: 8.7 fL (ref 7.5–12.5)
Monocytes Relative: 8.6 %
Neutro Abs: 3037 cells/uL (ref 1500–7800)
Neutrophils Relative %: 48.2 %
Platelets: 265 10*3/uL (ref 140–400)
RBC: 5.45 10*6/uL (ref 4.20–5.80)
RDW: 12.6 % (ref 11.0–15.0)
Total Lymphocyte: 40.7 %
WBC: 6.3 10*3/uL (ref 3.8–10.8)

## 2019-10-09 LAB — THYROID PANEL WITH TSH
Free Thyroxine Index: 2.4 (ref 1.4–3.8)
T3 Uptake: 30 % (ref 22–35)
T4, Total: 7.9 ug/dL (ref 4.9–10.5)
TSH: 4.67 mIU/L — ABNORMAL HIGH (ref 0.40–4.50)

## 2019-10-09 LAB — COMPLETE METABOLIC PANEL WITH GFR
AG Ratio: 1.7 (calc) (ref 1.0–2.5)
ALT: 35 U/L (ref 9–46)
AST: 19 U/L (ref 10–40)
Albumin: 4.6 g/dL (ref 3.6–5.1)
Alkaline phosphatase (APISO): 56 U/L (ref 36–130)
BUN: 13 mg/dL (ref 7–25)
CO2: 27 mmol/L (ref 20–32)
Calcium: 9.8 mg/dL (ref 8.6–10.3)
Chloride: 103 mmol/L (ref 98–110)
Creat: 1.04 mg/dL (ref 0.60–1.35)
GFR, Est African American: 113 mL/min/{1.73_m2} (ref >=60)
GFR, Est Non African American: 97 mL/min/{1.73_m2} (ref >=60)
Globulin: 2.7 g/dL (calc) (ref 1.9–3.7)
Glucose, Bld: 88 mg/dL (ref 65–99)
Potassium: 4.1 mmol/L (ref 3.5–5.3)
Sodium: 137 mmol/L (ref 135–146)
Total Bilirubin: 0.7 mg/dL (ref 0.2–1.2)
Total Protein: 7.3 g/dL (ref 6.1–8.1)

## 2019-10-09 LAB — TEST AUTHORIZATION

## 2019-10-09 LAB — HEMOGLOBIN A1C W/OUT EAG: Hgb A1c MFr Bld: 5.1 % of total Hgb (ref <5.7)

## 2019-10-28 LAB — THYROID PANEL WITH TSH
Free Thyroxine Index: 2.3 (ref 1.4–3.8)
T3 Uptake: 31 % (ref 22–35)
T4, Total: 7.4 ug/dL (ref 4.9–10.5)
TSH: 1.36 mIU/L (ref 0.40–4.50)

## 2019-12-16 ENCOUNTER — Encounter: Payer: Self-pay | Admitting: Family Medicine

## 2020-04-06 ENCOUNTER — Encounter: Payer: Self-pay | Admitting: Family Medicine

## 2020-04-06 ENCOUNTER — Encounter: Payer: No Typology Code available for payment source | Admitting: Family Medicine

## 2020-04-06 DIAGNOSIS — M25532 Pain in left wrist: Secondary | ICD-10-CM

## 2020-08-05 ENCOUNTER — Telehealth: Payer: No Typology Code available for payment source | Admitting: Physician Assistant

## 2020-08-05 DIAGNOSIS — H6502 Acute serous otitis media, left ear: Secondary | ICD-10-CM | POA: Diagnosis not present

## 2020-08-05 MED ORDER — CIPRO HC 0.2-1 % OT SUSP: 3.0000 [drp] | Freq: Two times a day (BID) | OTIC | 0 refills | Status: DC

## 2020-08-05 NOTE — Progress Notes (Signed)
E Visit for Otitis Media (Inner ear infection)  We are sorry that you are not feeling well. Here is how we plan to help!  I have prescribed: Ciprofloxin 0.2% and hydrocortisone 1% otic suspension 3 drops in affected ears twice daily for 7 days   If you have a fever 102 and up and significantly worsening symptoms, this could indicate a more serious infection moving to the middle/inner and needs face to face evaluation in an office by a provider.  Your symptoms should improve over the next 3 days and should resolve in about 7 days.  HOME CARE:   Wash your hands frequently.  Do not place the tip of the bottle on your ear or touch it with your fingers.  You can take Acetominophen 650 mg every 4-6 hours as needed for pain.  If pain is severe or moderate, you can apply a heating pad (set on low) or hot water bottle (wrapped in a towel) to outer ear for 20 minutes.  This will also increase drainage.  Avoid ear plugs  Do not use Q-tips  After showers, help the water run out by tilting your head to one side.  GET HELP RIGHT AWAY IF:   Fever is over 102.2 degrees.  You develop progressive ear pain or hearing loss.  Ear symptoms persist longer than 3 days after treatment.  MAKE SURE YOU:   Understand these instructions.  Will watch your condition.  Will get help right away if you are not doing well or get worse.   Thank you for choosing an e-visit. Your e-visit answers were reviewed by a board certified advanced clinical practitioner to complete your personal care plan. Depending upon the condition, your plan could have included both over the counter or prescription medications. Please review your pharmacy choice. Be sure that the pharmacy you have chosen is open so that you can pick up your prescription now.  If there is a problem you may message your provider in MyChart to have the prescription routed to another pharmacy. Your safety is important to [place]. If you have drug  allergies check your prescription carefully.  For the next 24 hours, you can use MyChart to ask questions about today's visit, request a non-urgent call back, or ask for a work or school excuse from your e-visit provider. You will get an email in the next two days asking about your experience. I hope that your e-visit has been valuable and will speed your recovery.  I provided 5 minutes of non face-to-face time during this encounter for chart review and documentation.

## 2020-12-26 IMAGING — DX DG WRIST COMPLETE 3+V*L*
4 series · 4 of 4 positions shown · non-contrast
Comparison: None.

CLINICAL DATA: Left wrist pain for 2 months.

EXAM:
LEFT WRIST - COMPLETE 3+ VIEW

[wrist ap]
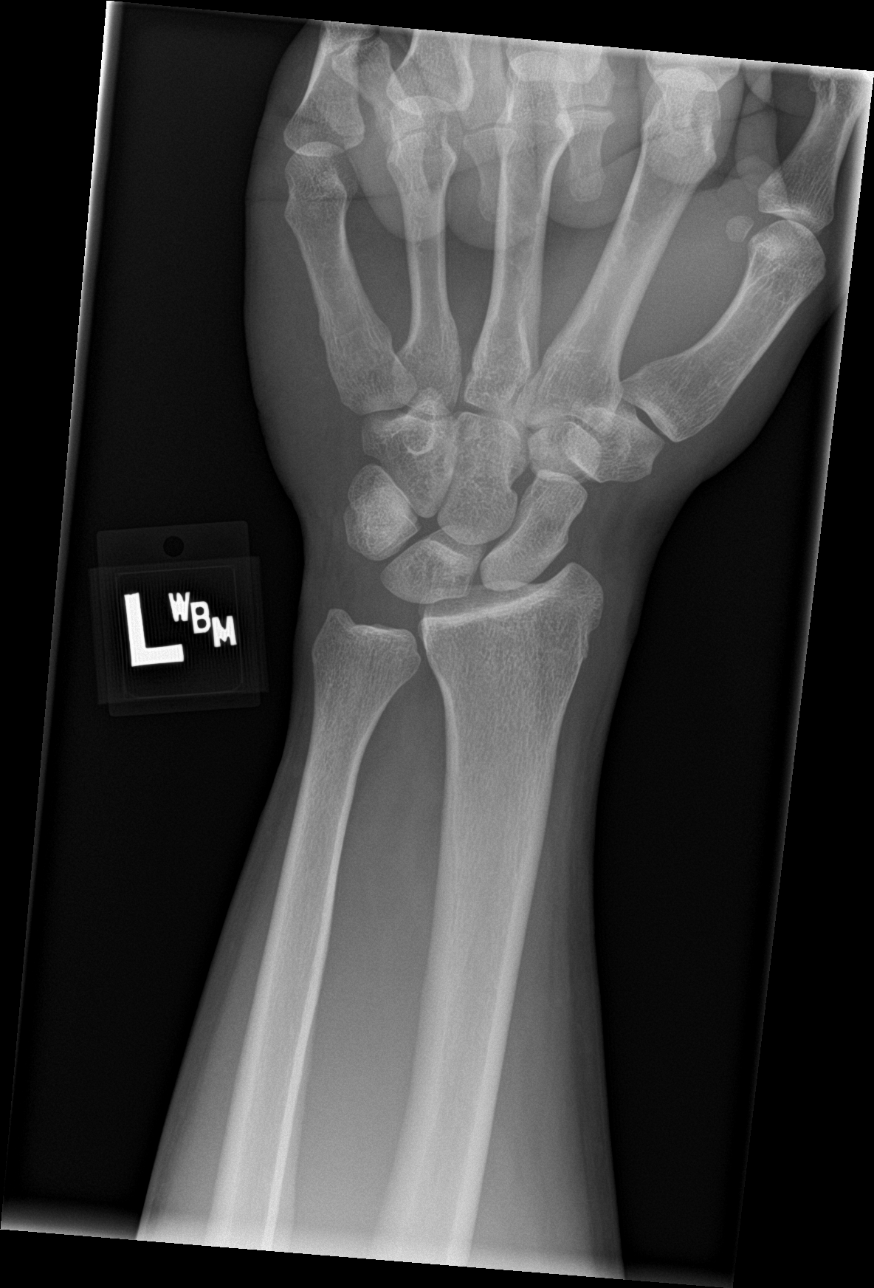

[wrist obl]
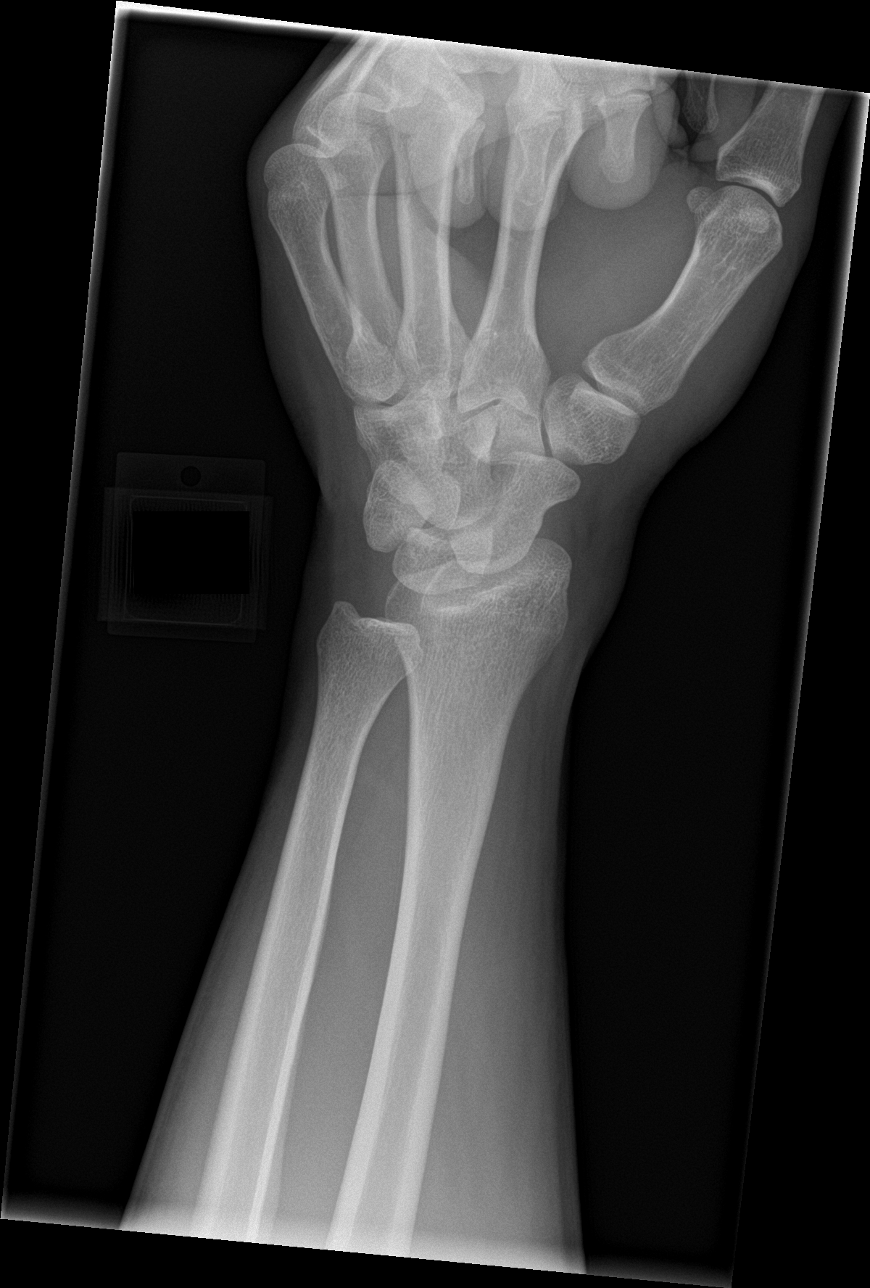

[wrist lat]
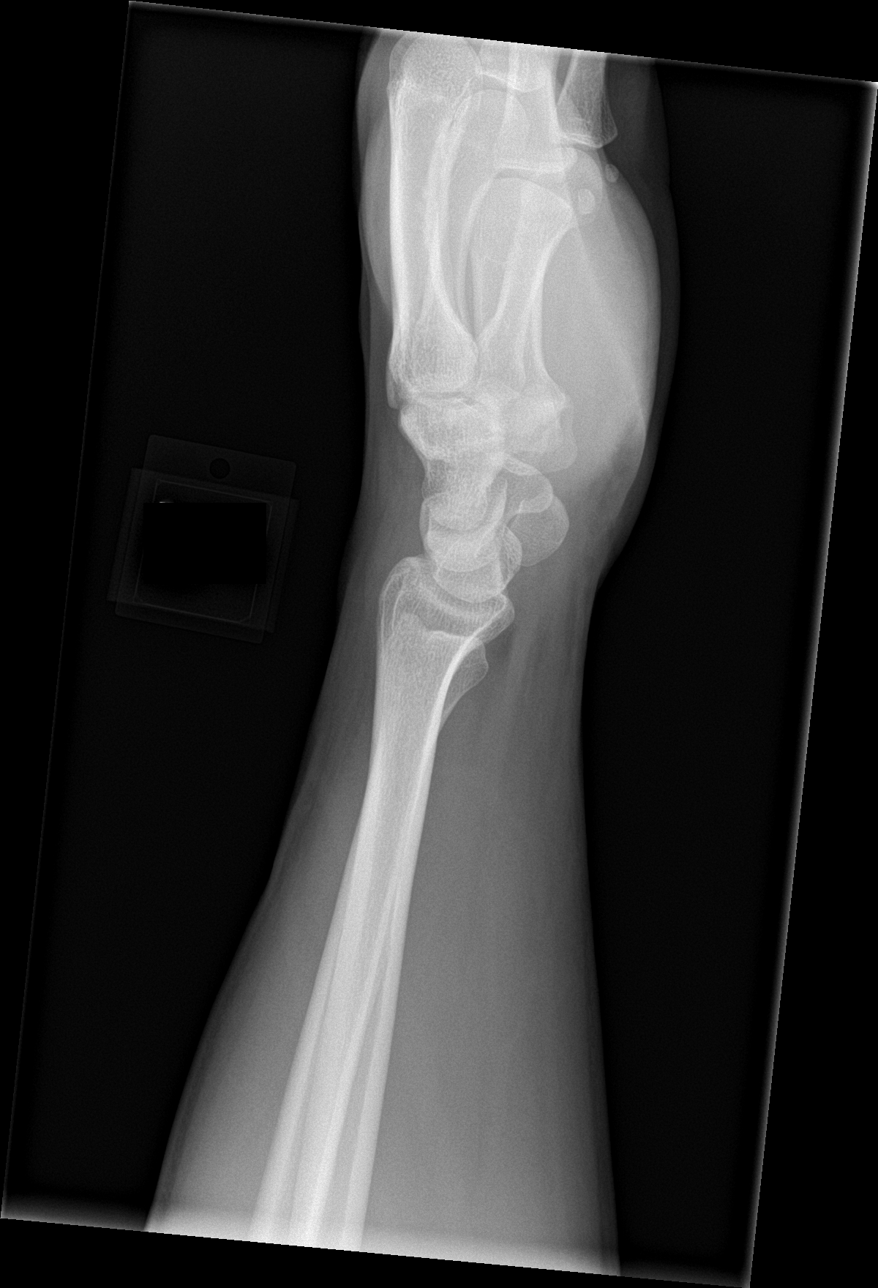

[wrist navicular]
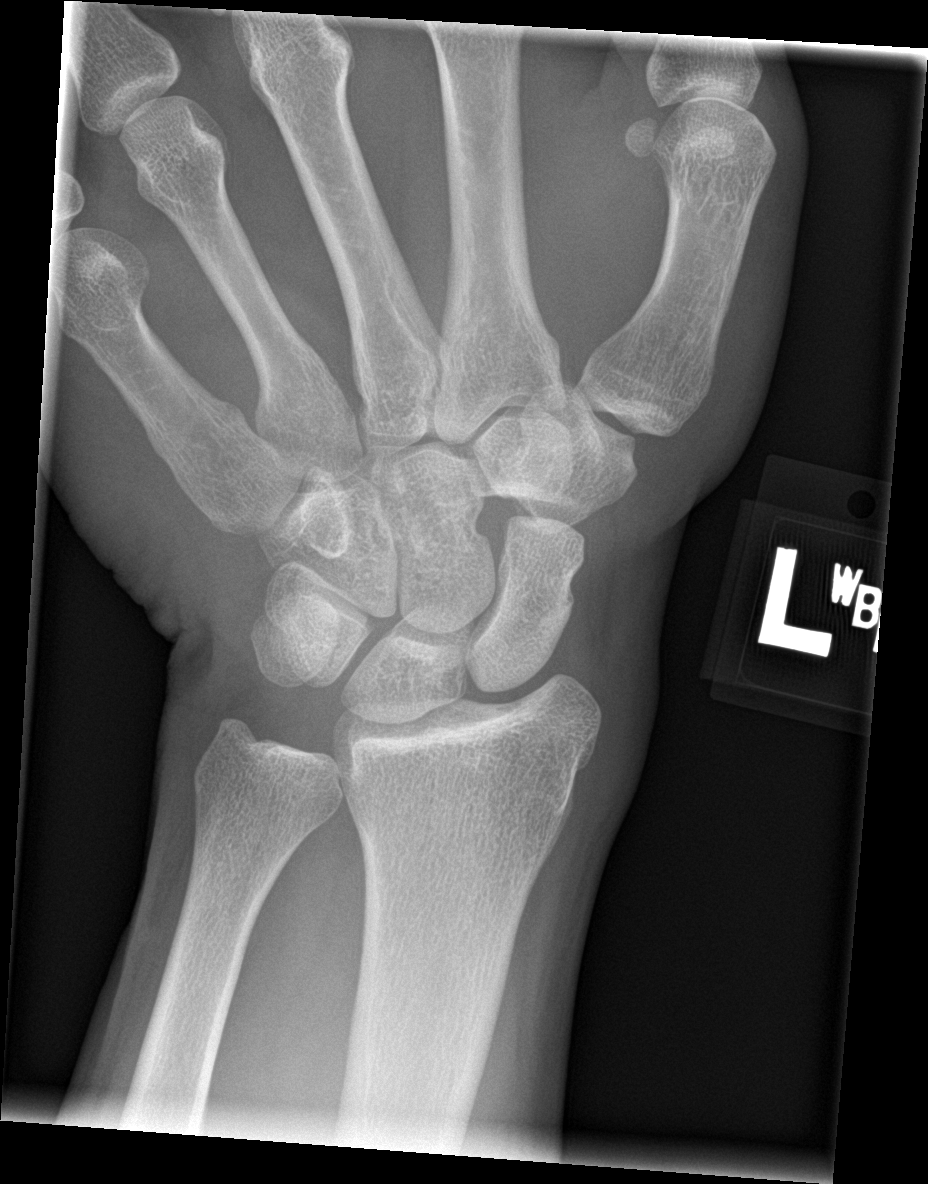

[4 of 4 positions shown; findings below may reference images not displayed]

FINDINGS: There is no evidence of fracture or dislocation. Questionable cystic
changes involving the lunate. Mild ulna minus variance. No erosions.
Soft tissues are unremarkable.
IMPRESSION: 1. Questionable cystic changes involving the lunate with mild ulna
minus variance.
2. Consider further evaluation with MRI to assess for avascular
necrosis.

## 2021-04-26 ENCOUNTER — Other Ambulatory Visit (HOSPITAL_COMMUNITY): Payer: Self-pay

## 2021-04-26 ENCOUNTER — Telehealth: Payer: No Typology Code available for payment source | Admitting: Nurse Practitioner

## 2021-04-26 DIAGNOSIS — J0101 Acute recurrent maxillary sinusitis: Secondary | ICD-10-CM | POA: Diagnosis not present

## 2021-04-26 MED ORDER — AMOXICILLIN-POT CLAVULANATE 875-125 MG PO TABS
1.0000 | ORAL_TABLET | Freq: Two times a day (BID) | ORAL | 0 refills | Status: DC
  Filled 2021-04-26: qty 14, 7d supply, fill #0

## 2021-04-26 NOTE — Progress Notes (Signed)

## 2021-10-11 ENCOUNTER — Ambulatory Visit: Payer: No Typology Code available for payment source | Admitting: Nurse Practitioner

## 2021-11-13 ENCOUNTER — Encounter: Payer: Self-pay | Admitting: Nurse Practitioner

## 2021-11-13 ENCOUNTER — Other Ambulatory Visit: Payer: Self-pay

## 2021-11-13 ENCOUNTER — Ambulatory Visit (INDEPENDENT_AMBULATORY_CARE_PROVIDER_SITE_OTHER): Payer: No Typology Code available for payment source | Admitting: Nurse Practitioner

## 2021-11-13 VITALS — BP 122/68 | HR 88 | Temp 98.5°F | Resp 18 | Ht 69.0 in | Wt 189.1 lb

## 2021-11-13 DIAGNOSIS — Z131 Encounter for screening for diabetes mellitus: Secondary | ICD-10-CM | POA: Diagnosis not present

## 2021-11-13 DIAGNOSIS — Z13220 Encounter for screening for lipoid disorders: Secondary | ICD-10-CM

## 2021-11-13 DIAGNOSIS — Z0000 Encounter for general adult medical examination without abnormal findings: Secondary | ICD-10-CM | POA: Diagnosis not present

## 2021-11-13 DIAGNOSIS — Z130 Encounter for screening for diseases of the blood and blood-forming organs and certain disorders involving the immune mechanism: Secondary | ICD-10-CM

## 2021-11-13 DIAGNOSIS — Z1159 Encounter for screening for other viral diseases: Secondary | ICD-10-CM

## 2021-11-13 DIAGNOSIS — Z7689 Persons encountering health services in other specified circumstances: Secondary | ICD-10-CM

## 2021-11-13 NOTE — Progress Notes (Signed)
esta

## 2021-11-13 NOTE — Progress Notes (Signed)
Name: Francisco Herring   MRN: [id number]    DOB: August 06, 1990   Date:11/13/2021       Progress Note  Subjective  Chief Complaint  Chief Complaint  Patient presents with   Establish Care    HPI  Patient presents for annual CPE and to establish care. Establish care: His last physical was in 2021.  He denies any medical problems, no recent surgeries.  He takes multivitamins. Family history of htn, skin cancer, prostate cancer.  Diet: He says he hates vegetables, but gets protein  and dairy. Exercise:  Walks, uses a treadmill, and pickle ball on weekend Sleep:7 hours Stress management: goes on long vacations to help with stress Depression: phq 9 is negative    11/13/2021   10:29 AM 10/02/2019   10:18 AM 07/02/2018    8:51 AM 04/30/2017   12:39 PM  Depression screen PHQ 2/9  Decreased Interest 0 0 1 0  Down, Depressed, Hopeless 0 0 0 0  PHQ - 2 Score 0 0 1 0    Hypertension:  BP Readings from Last 3 Encounters:  11/13/21 122/68  10/02/19 125/76  10/17/18 110/84    Obesity: Wt Readings from Last 3 Encounters:  11/13/21 189 lb 1.6 oz (85.8 kg)  10/02/19 186 lb 3.2 oz (84.5 kg)  10/17/18 153 lb 9.6 oz (69.7 kg)   BMI Readings from Last 3 Encounters:  11/13/21 27.93 kg/m  10/02/19 27.50 kg/m  10/17/18 22.04 kg/m     Lipids:  Lab Results  Component Value Date   CHOL 217 (H) 10/07/2019   CHOL 147 07/02/2018   CHOL 205 (H) 04/30/2017   Lab Results  Component Value Date   HDL 43 10/07/2019   HDL 45.00 07/02/2018   HDL 45.70 04/30/2017   Lab Results  Component Value Date   LDLCALC 155 (H) 10/07/2019   LDLCALC 87 07/02/2018   LDLCALC 144 (H) 04/30/2017   Lab Results  Component Value Date   TRIG 88 10/07/2019   TRIG 77.0 07/02/2018   TRIG 79.0 04/30/2017   Lab Results  Component Value Date   CHOLHDL 5.0 (H) 10/07/2019   CHOLHDL 3 07/02/2018   CHOLHDL 4 04/30/2017   No results found for: "LDLDIRECT" Glucose:  Glucose, Bld  Date Value Ref Range Status   10/07/2019 88 65 - 99 mg/dL Final    Comment:    .            Fasting reference interval .   07/02/2018 79 70 - 99 mg/dL Final  35/36/1443 154 (H) 70 - 99 mg/dL Final    Flowsheet Row Office Visit from 11/13/2021 in [facility]  AUDIT-C Score 0        Married STD testing and prevention (HIV/chl/gon/syphilis): 07/02/2018 Hep C: ordered today  Skin cancer: Discussed monitoring for atypical lesions Colorectal cancer: no concerns, not due Prostate cancer: no concerns, not due No results found for: "PSA"  Lung cancer:   Low Dose CT Chest recommended if Age 25-80 years, 30 pack-year currently smoking OR have quit w/in 15years. Patient does not qualify.   AAA:  The USPSTF recommends one-time screening with ultrasonography in men ages 73 to 74 years who have ever smoked ECG:  none  Vaccines:  HPV: up to at age 78 , ask insurance if age between 63-45  Shingrix: 26-64 yo and ask insurance if covered when patient above 77 yo Pneumonia:  educated and discussed with patient. Flu:  educated and discussed with patient.  Advanced  Care Planning: A voluntary discussion about advance care planning including the explanation and discussion of advance directives.  Discussed health care proxy and Living will, and the patient was able to identify a health care proxy as husband, Duncan Dull.  Patient does not have a living will at present time. If patient does have living will, I have requested they bring this to the clinic to be scanned in to their chart.  There are no problems to display for this patient.   Past Surgical History:  Procedure Laterality Date   TYMPANOPLASTY Bilateral     Family History  Problem Relation Age of Onset   Heart disease Mother        Genetic high cholesterol   Thyroid disease Mother    Heart disease Father        High cholesterol   ADD / ADHD Maternal Grandmother    Thyroid disease Maternal Grandmother    ADD / ADHD Maternal Grandfather     Depression Maternal Grandfather    Heart disease Maternal Grandfather        Genetic high cholesterol requiring bypass at 30    Social History   Socioeconomic History   Marital status: Married    Spouse name: Phineas Semen   Number of children: Not on file   Years of education: Not on file   Highest education level: Not on file  Occupational History   Not on file  Tobacco Use   Smoking status: Never   Smokeless tobacco: Never  Vaping Use   Vaping Use: Never used  Substance and Sexual Activity   Alcohol use: Never   Drug use: No   Sexual activity: Not on file  Other Topics Concern   Not on file  Social History Narrative   Works at [employer] [place]   Social Determinants of Health   Financial Resource Strain: Low Risk  (11/13/2021)   Overall Financial Resource Strain (CARDIA)    Difficulty of Paying Living Expenses: Not hard at all  Food Insecurity: No Food Insecurity (11/13/2021)   Hunger Vital Sign    Worried About Running Out of Food in the Last Year: Never true    Ran Out of Food in the Last Year: Never true  Transportation Needs: No Transportation Needs (11/13/2021)   PRAPARE - Administrator, Civil Service (Medical): No    Lack of Transportation (Non-Medical): No  Physical Activity: Sufficiently Active (11/13/2021)   Exercise Vital Sign    Days of Exercise per Week: 7 days    Minutes of Exercise per Session: 30 min  Stress: Stress Concern Present (11/13/2021)   [employer] of Occupational Health - Occupational Stress Questionnaire    Feeling of Stress : To some extent  Social Connections: Socially Integrated (11/13/2021)   Social Connection and Isolation Panel [NHANES]    Frequency of Communication with Friends and Family: More than three times a week    Frequency of Social Gatherings with Friends and Family: More than three times a week    Attends Religious Services: More than 4 times per year    Active Member of [employer] or Organizations: Yes     Attends Banker Meetings: More than 4 times per year    Marital Status: Married  Catering manager Violence: Not At Risk (11/13/2021)   Humiliation, Afraid, Rape, and Kick questionnaire    Fear of Current or Ex-Partner: No    Emotionally Abused: No    Physically Abused: No  Sexually Abused: No     Current Outpatient Medications:    Cholecalciferol (VITAMIN D3) 10 MCG (400 UNIT) CAPS, Take by mouth., Disp: , Rfl:    Multiple Vitamin (MULTI-VITAMINS) TABS, Take by mouth., Disp: , Rfl:    Omega-3 Fatty Acids (FISH OIL) 1000 MG CAPS, Take by mouth., Disp: , Rfl:   Allergies  Allergen Reactions   Sulfa Antibiotics Hives     ROS  Constitutional: Negative for fever or weight change.  Respiratory: Negative for cough and shortness of breath.   Cardiovascular: Negative for chest pain or palpitations.  Gastrointestinal: Negative for abdominal pain, no bowel changes.  Musculoskeletal: Negative for gait problem or joint swelling.  Skin: Negative for rash.  Neurological: Negative for dizziness or headache.  No other specific complaints in a complete review of systems (except as listed in HPI above).    Objective  Vitals:   11/13/21 1026  BP: 122/68  Pulse: 88  Resp: 18  Temp: 98.5 F (36.9 C)  TempSrc: Oral  SpO2: 97%  Weight: 189 lb 1.6 oz (85.8 kg)  Height: 5\' 9"  (1.753 m)    Body mass index is 27.93 kg/m.  Physical Exam Constitutional: Patient appears well-developed and well-nourished. No distress.  HENT: Head: Normocephalic and atraumatic. Ears: B TMs ok, no erythema or effusion; Nose: Nose normal. Mouth/Throat: Oropharynx is clear and moist. No oropharyngeal exudate.  Eyes: Conjunctivae and EOM are normal. Pupils are equal, round, and reactive to light. No scleral icterus.  Neck: Normal range of motion. Neck supple. No JVD present. No thyromegaly present.  Cardiovascular: Normal rate, regular rhythm and normal heart sounds.  No murmur heard. No BLE  edema. Pulmonary/Chest: Effort normal and breath sounds normal. No respiratory distress. Abdominal: Soft. Bowel sounds are normal, no distension. There is no tenderness. no masses Musculoskeletal: Normal range of motion, no joint effusions. No gross deformities Neurological: he is alert and oriented to person, place, and time. No cranial nerve deficit. Coordination, balance, strength, speech and gait are normal.  Skin: Skin is warm and dry. No rash noted. No erythema.  Psychiatric: Patient has a normal mood and affect. behavior is normal. Judgment and thought content normal.   No results found for this or any previous visit (from the past 2160 hour(s)).   Fall Risk:    11/13/2021   10:29 AM  Fall Risk   Falls in the past year? 0  Number falls in past yr: 0  Injury with Fall? 0  Follow up Falls evaluation completed     Functional Status Survey: Is the patient deaf or have difficulty hearing?: No Does the patient have difficulty seeing, even when wearing glasses/contacts?: No Does the patient have difficulty concentrating, remembering, or making decisions?: No Does the patient have difficulty walking or climbing stairs?: No Does the patient have difficulty dressing or bathing?: No Does the patient have difficulty doing errands alone such as visiting a doctor's office or shopping?: No    Assessment & Plan  1. Encounter for annual physical exam  - Lipid panel - CBC with Differential/Platelet - COMPLETE METABOLIC PANEL WITH GFR - Hemoglobin A1c - Hepatitis C antibody  2. Encounter to establish care  - Lipid panel - CBC with Differential/Platelet - COMPLETE METABOLIC PANEL WITH GFR - Hemoglobin A1c - Hepatitis C antibody  3. Screening for diabetes mellitus  - COMPLETE METABOLIC PANEL WITH GFR - Hemoglobin A1c  4. Screening for cholesterol level  - Lipid panel  5. Screening for deficiency anemia  -  CBC with Differential/Platelet  6. Encounter for hepatitis C  screening test for low risk patient  - Hepatitis C antibody     -Prostate cancer screening and PSA options (with potential risks and benefits of testing vs not testing) were discussed along with recent recs/guidelines. -USPSTF grade A and B recommendations reviewed with patient; age-appropriate recommendations, preventive care, screening tests, etc discussed and encouraged; healthy living encouraged; see AVS for patient education given to patient -Discussed importance of 150 minutes of physical activity weekly, eat two servings of fish weekly, eat one serving of tree nuts ( cashews, pistachios, pecans, almonds.Marland Kitchen) every other day, eat 6 servings of fruit/vegetables daily and drink plenty of water and avoid sweet beverages.

## 2021-11-14 ENCOUNTER — Other Ambulatory Visit: Payer: Self-pay | Admitting: Nurse Practitioner

## 2021-11-14 DIAGNOSIS — E7849 Other hyperlipidemia: Secondary | ICD-10-CM

## 2021-11-14 DIAGNOSIS — E782 Mixed hyperlipidemia: Secondary | ICD-10-CM

## 2021-11-19 LAB — LIPID PANEL
Cholesterol: 219 mg/dL — ABNORMAL HIGH (ref <200)
HDL: 45 mg/dL (ref >=40)
LDL Cholesterol (Calc): 154 mg/dL (calc) — ABNORMAL HIGH
Non-HDL Cholesterol (Calc): 174 mg/dL (calc) — ABNORMAL HIGH (ref <130)
Total CHOL/HDL Ratio: 4.9 (calc) (ref <5.0)
Triglycerides: 95 mg/dL (ref <150)

## 2021-11-19 LAB — COMPLETE METABOLIC PANEL WITH GFR
AG Ratio: 1.8 (calc) (ref 1.0–2.5)
ALT: 32 U/L (ref 9–46)
AST: 23 U/L (ref 10–40)
Albumin: 4.8 g/dL (ref 3.6–5.1)
Alkaline phosphatase (APISO): 68 U/L (ref 36–130)
BUN: 14 mg/dL (ref 7–25)
CO2: 24 mmol/L (ref 20–32)
Calcium: 9.9 mg/dL (ref 8.6–10.3)
Chloride: 105 mmol/L (ref 98–110)
Creat: 1.03 mg/dL (ref 0.60–1.26)
Globulin: 2.6 g/dL (calc) (ref 1.9–3.7)
Glucose, Bld: 92 mg/dL (ref 65–99)
Potassium: 4.5 mmol/L (ref 3.5–5.3)
Sodium: 140 mmol/L (ref 135–146)
Total Bilirubin: 0.5 mg/dL (ref 0.2–1.2)
Total Protein: 7.4 g/dL (ref 6.1–8.1)
eGFR: 100 mL/min/{1.73_m2} (ref >=60)

## 2021-11-19 LAB — TEST AUTHORIZATION

## 2021-11-19 LAB — CBC WITH DIFFERENTIAL/PLATELET
Absolute Monocytes: 410 cells/uL (ref 200–950)
Basophils Absolute: 23 cells/uL (ref 0–200)
Basophils Relative: 0.4 %
Eosinophils Absolute: 63 cells/uL (ref 15–500)
Eosinophils Relative: 1.1 %
HCT: 46.5 % (ref 38.5–50.0)
Hemoglobin: 16.1 g/dL (ref 13.2–17.1)
Lymphs Abs: 1818 cells/uL (ref 850–3900)
MCH: 29.5 pg (ref 27.0–33.0)
MCHC: 34.6 g/dL (ref 32.0–36.0)
MCV: 85.3 fL (ref 80.0–100.0)
MPV: 9 fL (ref 7.5–12.5)
Monocytes Relative: 7.2 %
Neutro Abs: 3386 cells/uL (ref 1500–7800)
Neutrophils Relative %: 59.4 %
Platelets: 341 10*3/uL (ref 140–400)
RBC: 5.45 10*6/uL (ref 4.20–5.80)
RDW: 12.9 % (ref 11.0–15.0)
Total Lymphocyte: 31.9 %
WBC: 5.7 10*3/uL (ref 3.8–10.8)

## 2021-11-19 LAB — HEMOGLOBIN A1C
Hgb A1c MFr Bld: 5.1 % of total Hgb (ref <5.7)
Mean Plasma Glucose: 100 mg/dL
eAG (mmol/L): 5.5 mmol/L

## 2021-11-19 LAB — HEPATITIS C ANTIBODY: Hepatitis C Ab: NONREACTIVE

## 2021-11-19 LAB — LIPOPROTEIN A (LPA): Lipoprotein (a): <10 nmol/L (ref <75)

## 2021-12-01 ENCOUNTER — Ambulatory Visit: Payer: No Typology Code available for payment source | Admitting: Nurse Practitioner

## 2022-11-12 NOTE — Progress Notes (Deleted)
Name: Francisco Herring   MRN: [id number]    DOB: Aug 26, 1990   Date:11/12/2022       Progress Note  Subjective  Chief Complaint  No chief complaint on file.   HPI  Patient presents for annual CPE ***.  IPSS Questionnaire (AUA-7): Over the past month.   1)  How often have you had a sensation of not emptying your bladder completely after you finish urinating?  {Rating:19227}  2)  How often have you had to urinate again less than two hours after you finished urinating? {Rating:19227}  3)  How often have you found you stopped and started again several times when you urinated?  {Rating:19227}  4) How difficult have you found it to postpone urination?  {Rating:19227}  5) How often have you had a weak urinary stream?  {Rating:19227}  6) How often have you had to push or strain to begin urination?  {Rating:19227}  7) How many times did you most typically get up to urinate from the time you went to bed until the time you got up in the morning?  {Rating:19228}  Total score:  0-7 mildly symptomatic   8-19 moderately symptomatic   20-35 severely symptomatic     Diet: *** Exercise: *** Last Dental Exam: **** Last Eye Exam: ***  Depression: phq 9 is {gen pos neg:315643}    11/13/2021   10:29 AM 10/02/2019   10:18 AM 07/02/2018    8:51 AM 04/30/2017   12:39 PM  Depression screen PHQ 2/9  Decreased Interest 0 0 1 0  Down, Depressed, Hopeless 0 0 0 0  PHQ - 2 Score 0 0 1 0    Hypertension:  BP Readings from Last 3 Encounters:  11/13/21 122/68  10/02/19 125/76  10/17/18 110/84    Obesity: Wt Readings from Last 3 Encounters:  11/13/21 189 lb 1.6 oz (85.8 kg)  10/02/19 186 lb 3.2 oz (84.5 kg)  10/17/18 153 lb 9.6 oz (69.7 kg)   BMI Readings from Last 3 Encounters:  11/13/21 27.93 kg/m  10/02/19 27.50 kg/m  10/17/18 22.04 kg/m     Lipids:  Lab Results  Component Value Date   CHOL 219 (H) 11/13/2021   CHOL 217 (H) 10/07/2019   CHOL 147 07/02/2018   Lab Results  Component  Value Date   HDL 45 11/13/2021   HDL 43 10/07/2019   HDL 45.00 07/02/2018   Lab Results  Component Value Date   LDLCALC 154 (H) 11/13/2021   LDLCALC 155 (H) 10/07/2019   LDLCALC 87 07/02/2018   Lab Results  Component Value Date   TRIG 95 11/13/2021   TRIG 88 10/07/2019   TRIG 77.0 07/02/2018   Lab Results  Component Value Date   CHOLHDL 4.9 11/13/2021   CHOLHDL 5.0 (H) 10/07/2019   CHOLHDL 3 07/02/2018   No results found for: "LDLDIRECT" Glucose:  Glucose, Bld  Date Value Ref Range Status  11/13/2021 92 65 - 99 mg/dL Final    Comment:    .            Fasting reference interval .   10/07/2019 88 65 - 99 mg/dL Final    Comment:    .            Fasting reference interval .   07/02/2018 79 70 - 99 mg/dL Final    Flowsheet Row Office Visit from 11/13/20[facility] in [facility]  AUDIT-C Score 0      ***  Married STD testing and  prevention (HIV/chl/gon/syphilis):  {yes/no/default n/a:21102::"not applicable"} Sexual history:  Hep C Screening:  Skin cancer: Discussed monitoring for atypical lesions Colorectal cancer: *** Prostate cancer:  {yes/no/default n/a:21102::"not applicable"} No results found for: "PSA"   Lung cancer:  Low Dose CT Chest recommended if Age 40-80 years, 30 pack-year currently smoking OR have quit w/in 15years. Patient  {Response; is/is not/na:63} a candidate for screening   AAA: The USPSTF recommends one-time screening with ultrasonography in men ages 82 to 75 years who have ever smoked. Patient   {Response; is /is not/na:63}, a candidate for screening  ECG:  ***  Vaccines:   HPV:  Tdap:  Shingrix:  Pneumonia:  Flu:  COVID-19:  Advanced Care Planning: A voluntary discussion about advance care planning including the explanation and discussion of advance directives.  Discussed health care proxy and Living will, and the patient was able to identify a health care proxy as ***.  Patient {DOES_DOES WJX:91478} have a  living will and power of attorney of health care   There are no problems to display for this patient.   Past Surgical History:  Procedure Laterality Date   TYMPANOPLASTY Bilateral     Family History  Problem Relation Age of Onset   Heart disease Mother        Genetic high cholesterol   Thyroid disease Mother    Heart disease Father        High cholesterol   ADD / ADHD Maternal Grandmother    Thyroid disease Maternal Grandmother    ADD / ADHD Maternal Grandfather    Depression Maternal Grandfather    Heart disease Maternal Grandfather        Genetic high cholesterol requiring bypass at 20    Social History   Socioeconomic History   Marital status: Married    Spouse name: Phineas Semen   Number of children: Not on file   Years of education: Not on file   Highest education level: Not on file  Occupational History   Not on file  Tobacco Use   Smoking status: Never   Smokeless tobacco: Never  Vaping Use   Vaping status: Never Used  Substance and Sexual Activity   Alcohol use: Never   Drug use: No   Sexual activity: Not on file  Other Topics Concern   Not on file  Social History Narrative   Works at [employer] [place]   Social Determinants of Health   Financial Resource Strain: Low Risk  (11/13/2021)   Overall Financial Resource Strain (CARDIA)    Difficulty of Paying Living Expenses: Not hard at all  Food Insecurity: No Food Insecurity (11/13/2021)   Hunger Vital Sign    Worried About Running Out of Food in the Last Year: Never true    Ran Out of Food in the Last Year: Never true  Transportation Needs: No Transportation Needs (11/13/2021)   PRAPARE - Administrator, Civil Service (Medical): No    Lack of Transportation (Non-Medical): No  Physical Activity: Sufficiently Active (11/13/2021)   Exercise Vital Sign    Days of Exercise per Week: 7 days    Minutes of Exercise per Session: 30 min  Stress: Stress Concern Present (11/13/2021)   [employer] of Occupational Health - Occupational Stress Questionnaire    Feeling of Stress : To some extent  Social Connections: Socially Integrated (11/13/2021)   Social Connection and Isolation Panel [NHANES]    Frequency of Communication with Friends and Family: More than three times a  week    Frequency of Social Gatherings with Friends and Family: More than three times a week    Attends Religious Services: More than 4 times per year    Active Member of [employer] or Organizations: Yes    Attends Engineer, structural: More than 4 times per year    Marital Status: Married  Catering manager Violence: Not At Risk (11/13/2021)   Humiliation, Afraid, Rape, and Kick questionnaire    Fear of Current or Ex-Partner: No    Emotionally Abused: No    Physically Abused: No    Sexually Abused: No     Current Outpatient Medications:    Cholecalciferol (VITAMIN D3) 10 MCG (400 UNIT) CAPS, Take by mouth., Disp: , Rfl:    Multiple Vitamin (MULTI-VITAMINS) TABS, Take by mouth., Disp: , Rfl:    Omega-3 Fatty Acids (FISH OIL) 1000 MG CAPS, Take by mouth., Disp: , Rfl:   Allergies  Allergen Reactions   Sulfa Antibiotics Hives     ROS  ***   Objective  There were no vitals filed for this visit.  There is no height or weight on file to calculate BMI.  Physical Exam ***  No results found for this or any previous visit (from the past 2160 hour(s)).   Fall Risk:    11/13/2021   10:29 AM  Fall Risk   Falls in the past year? 0  Number falls in past yr: 0  Injury with Fall? 0  Follow up Falls evaluation completed     Functional Status Survey:      Assessment & Plan  There are no diagnoses linked to this encounter.   -Prostate cancer screening and PSA options (with potential risks and benefits of testing vs not testing) were discussed along with recent recs/guidelines. -USPSTF grade A and B recommendations reviewed with patient; age-appropriate recommendations, preventive  care, screening tests, etc discussed and encouraged; healthy living encouraged; see AVS for patient education given to patient -Discussed importance of 150 minutes of physical activity weekly, eat two servings of fish weekly, eat one serving of tree nuts ( cashews, pistachios, pecans, almonds.Marland Kitchen) every other day, eat 6 servings of fruit/vegetables daily and drink plenty of water and avoid sweet beverages.  -Reviewed Health Maintenance: {yes/no/default n/a:21102::"not applicable"}

## 2022-11-12 NOTE — Progress Notes (Deleted)
.  ks

## 2022-11-15 ENCOUNTER — Encounter: Payer: No Typology Code available for payment source | Admitting: Nurse Practitioner

## 2022-12-10 NOTE — Progress Notes (Unsigned)
Name: Francisco Herring   MRN: [id number]    DOB: 07/09/1990   Date:12/12/2022       Progress Note  Subjective  Chief Complaint  Chief Complaint  Patient presents with   Annual Exam    HPI  Patient presents for annual CPE .  IPSS Questionnaire (AUA-7): Over the past month.   1)  How often have you had a sensation of not emptying your bladder completely after you finish urinating?  0 - Not at all  2)  How often have you had to urinate again less than two hours after you finished urinating? 0 - Not at all  3)  How often have you found you stopped and started again several times when you urinated?  0 - Not at all  4) How difficult have you found it to postpone urination?  0 - Not at all  5) How often have you had a weak urinary stream?  0 - Not at all  6) How often have you had to push or strain to begin urination?  0 - Not at all  7) How many times did you most typically get up to urinate from the time you went to bed until the time you got up in the morning?  0 - None  Total score:  0-7 mildly symptomatic   8-19 moderately symptomatic   20-35 severely symptomatic     Diet: Regular, tries to eat well balanced, hates veggies Exercise: 3 days a week 90 minutes Last Dental Exam: January 2024 Last Eye Exam: October 2023  Depression: phq 9 is negative    12/12/2022    8:15 AM 11/13/2021   10:29 AM 10/02/2019   10:18 AM 07/02/2018    8:51 AM 04/30/2017   12:39 PM  Depression screen PHQ 2/9  Decreased Interest 0 0 0 1 0  Down, Depressed, Hopeless 0 0 0 0 0  PHQ - 2 Score 0 0 0 1 0    Hypertension:  BP Readings from Last 3 Encounters:  12/12/22 122/74  11/13/21 122/68  10/02/19 125/76    Obesity: Wt Readings from Last 3 Encounters:  12/12/22 198 lb 6.4 oz (90 kg)  11/13/21 189 lb 1.6 oz (85.8 kg)  10/02/19 186 lb 3.2 oz (84.5 kg)   BMI Readings from Last 3 Encounters:  12/12/22 29.30 kg/m  11/13/21 27.93 kg/m  10/02/19 27.50 kg/m     Lipids:  Lab Results   Component Value Date   CHOL 219 (H) 11/13/2021   CHOL 217 (H) 10/07/2019   CHOL 147 07/02/2018   Lab Results  Component Value Date   HDL 45 11/13/2021   HDL 43 10/07/2019   HDL 45.00 07/02/2018   Lab Results  Component Value Date   LDLCALC 154 (H) 11/13/2021   LDLCALC 155 (H) 10/07/2019   LDLCALC 87 07/02/2018   Lab Results  Component Value Date   TRIG 95 11/13/2021   TRIG 88 10/07/2019   TRIG 77.0 07/02/2018   Lab Results  Component Value Date   CHOLHDL 4.9 11/13/2021   CHOLHDL 5.0 (H) 10/07/2019   CHOLHDL 3 07/02/2018   No results found for: "LDLDIRECT" Glucose:  Glucose, Bld  Date Value Ref Range Status  11/13/2021 92 65 - 99 mg/dL Final    Comment:    .            Fasting reference interval .   10/07/2019 88 65 - 99 mg/dL Final    Comment:    .  Fasting reference interval .   07/02/2018 79 70 - 99 mg/dL Final    Flowsheet Row Office Visit from 12/12/2022 in [facility]  AUDIT-C Score 0        Married STD testing and prevention (HIV/chl/gon/syphilis):  yes, completed Sexual history: yes Hep C Screening: completed Skin cancer: Discussed monitoring for atypical lesions Colorectal cancer: NA Prostate cancer:  not applicable No results found for: "PSA"   Lung cancer:  Low Dose CT Chest recommended if Age 52-80 years, 30 pack-year currently smoking OR have quit w/in 15years. Patient  not applicable a candidate for screening   AAA: The USPSTF recommends one-time screening with ultrasonography in men ages 39 to 75 years who have ever smoked. Patient   not applicable, a candidate for screening  ECG:  none  Vaccines:  HPV: up to at age 28 , ask insurance if age between 35-45  Shingrix: 71-64 yo and ask insurance if covered when patient above 32 yo Pneumonia:  educated and discussed with patient. Flu:  educated and discussed with patient.    Advanced Care Planning: A voluntary discussion about advance care  planning including the explanation and discussion of advance directives.  Discussed health care proxy and Living will, and the patient was able to identify a health care proxy as Duncan Dull, husband.  Patient does not have a living will and power of attorney of health care   There are no problems to display for this patient.   Past Surgical History:  Procedure Laterality Date   TYMPANOPLASTY Bilateral     Family History  Problem Relation Age of Onset   Heart disease Mother        Genetic high cholesterol   Thyroid disease Mother    Heart disease Father        High cholesterol   ADD / ADHD Maternal Grandmother    Thyroid disease Maternal Grandmother    ADD / ADHD Maternal Grandfather    Depression Maternal Grandfather    Heart disease Maternal Grandfather        Genetic high cholesterol requiring bypass at 33    Social History   Socioeconomic History   Marital status: Married    Spouse name: Phineas Semen   Number of children: Not on file   Years of education: Not on file   Highest education level: Bachelor's degree (e.g., BA, AB, BS)  Occupational History   Not on file  Tobacco Use   Smoking status: Never    Passive exposure: Never   Smokeless tobacco: Never  Vaping Use   Vaping status: Never Used  Substance and Sexual Activity   Alcohol use: Never   Drug use: No   Sexual activity: Not on file  Other Topics Concern   Not on file  Social History Narrative   Works at [employer] [place]   Social Determinants of Health   Financial Resource Strain: Low Risk  (12/12/2022)   Overall Financial Resource Strain (CARDIA)    Difficulty of Paying Living Expenses: Not hard at all  Food Insecurity: No Food Insecurity (12/12/2022)   Hunger Vital Sign    Worried About Running Out of Food in the Last Year: Never true    Ran Out of Food in the Last Year: Never true  Transportation Needs: No Transportation Needs (12/12/2022)   PRAPARE - Scientist, research (physical sciences) (Medical): No    Lack of Transportation (Non-Medical): No  Physical Activity: Sufficiently Active (12/12/2022)  Exercise Vital Sign    Days of Exercise per Week: 3 days    Minutes of Exercise per Session: 120 min  Recent Concern: Physical Activity - Insufficiently Active (12/11/2022)   Exercise Vital Sign    Days of Exercise per Week: 2 days    Minutes of Exercise per Session: 70 min  Stress: No Stress Concern Present (12/12/2022)   [employer] of Occupational Health - Occupational Stress Questionnaire    Feeling of Stress : Only a little  Social Connections: Moderately Integrated (12/12/2022)   Social Connection and Isolation Panel [NHANES]    Frequency of Communication with Friends and Family: More than three times a week    Frequency of Social Gatherings with Friends and Family: More than three times a week    Attends Religious Services: 1 to 4 times per year    Active Member of [employer] or Organizations: No    Attends Banker Meetings: Never    Marital Status: Married  Catering manager Violence: Not At Risk (12/12/2022)   Humiliation, Afraid, Rape, and Kick questionnaire    Fear of Current or Ex-Partner: No    Emotionally Abused: No    Physically Abused: No    Sexually Abused: No     Current Outpatient Medications:    Cholecalciferol (VITAMIN D3) 10 MCG (400 UNIT) CAPS, Take by mouth., Disp: , Rfl:    Multiple Vitamin (MULTI-VITAMINS) TABS, Take by mouth., Disp: , Rfl:    Omega-3 Fatty Acids (FISH OIL) 1000 MG CAPS, Take by mouth., Disp: , Rfl:   Allergies  Allergen Reactions   Sulfa Antibiotics Hives     ROS  Constitutional: Negative for fever or weight change.  Respiratory: Negative for cough and shortness of breath.   Cardiovascular: Negative for chest pain or palpitations.  Gastrointestinal: Negative for abdominal pain, no bowel changes.  Musculoskeletal: Negative for gait problem or joint swelling.  Skin: Negative for rash.   Neurological: Negative for dizziness or headache.  No other specific complaints in a complete review of systems (except as listed in HPI above).    Objective  Vitals:   12/12/22 0812  BP: 122/74  Pulse: 72  Resp: 16  Temp: 98.2 F (36.8 C)  TempSrc: Oral  SpO2: 96%  Weight: 198 lb 6.4 oz (90 kg)  Height: 5\' 9"  (1.753 m)    Body mass index is 29.3 kg/m.  Physical Exam  Constitutional: Patient appears well-developed and well-nourished. No distress.  HENT: Head: Normocephalic and atraumatic. Ears: B TMs ok, no erythema or effusion; Nose: Nose normal. Mouth/Throat: Oropharynx is clear and moist. No oropharyngeal exudate.  Eyes: Conjunctivae and EOM are normal. Pupils are equal, round, and reactive to light. No scleral icterus.  Neck: Normal range of motion. Neck supple. No JVD present. No thyromegaly present.  Cardiovascular: Normal rate, regular rhythm and normal heart sounds.  No murmur heard. No BLE edema. Pulmonary/Chest: Effort normal and breath sounds normal. No respiratory distress. Abdominal: Soft. Bowel sounds are normal, no distension. There is no tenderness. no masses Musculoskeletal: Normal range of motion, no joint effusions. No gross deformities Neurological: he is alert and oriented to person, place, and time. No cranial nerve deficit. Coordination, balance, strength, speech and gait are normal.  Skin: Skin is warm and dry. No rash noted. No erythema.  Psychiatric: Patient has a normal mood and affect. behavior is normal. Judgment and thought content normal.    Fall Risk:    12/12/2022    8:15 AM 11/13/2021  10:29 AM  Fall Risk   Falls in the past year? 0 0  Number falls in past yr: 0 0  Injury with Fall? 0 0  Follow up  Falls evaluation completed     Functional Status Survey: Is the patient deaf or have difficulty hearing?: No Does the patient have difficulty seeing, even when wearing glasses/contacts?: No Does the patient have difficulty  concentrating, remembering, or making decisions?: No Does the patient have difficulty walking or climbing stairs?: No Does the patient have difficulty dressing or bathing?: No Does the patient have difficulty doing errands alone such as visiting a doctor's office or shopping?: No    Assessment & Plan  1. Encounter for annual physical exam  - CBC with Differential/Platelet - COMPLETE METABOLIC PANEL WITH GFR - Lipid panel - Hemoglobin A1c  2. Mixed hyperlipidemia  - Lipid panel - Ambulatory referral to Cardiology - Lipoprotein A (LPA)  3. Screening for diabetes mellitus  - COMPLETE METABOLIC PANEL WITH GFR - Hemoglobin A1c  4. Screening for deficiency anemia  - CBC with Differential/Platelet  5. Family history of cardiac disorder -would like to see cardiologist regarding family history and hyperlipidemia - Ambulatory referral to Cardiology - Lipoprotein A (LPA)    -Prostate cancer screening and PSA options (with potential risks and benefits of testing vs not testing) were discussed along with recent recs/guidelines. -USPSTF grade A and B recommendations reviewed with patient; age-appropriate recommendations, preventive care, screening tests, etc discussed and encouraged; healthy living encouraged; see AVS for patient education given to patient -Discussed importance of 150 minutes of physical activity weekly, eat two servings of fish weekly, eat one serving of tree nuts ( cashews, pistachios, pecans, almonds.Marland Kitchen) every other day, eat 6 servings of fruit/vegetables daily and drink plenty of water and avoid sweet beverages.  -Reviewed Health Maintenance: yes

## 2022-12-12 ENCOUNTER — Other Ambulatory Visit: Payer: Self-pay

## 2022-12-12 ENCOUNTER — Ambulatory Visit (INDEPENDENT_AMBULATORY_CARE_PROVIDER_SITE_OTHER): Payer: 59 | Admitting: Nurse Practitioner

## 2022-12-12 ENCOUNTER — Encounter: Payer: Self-pay | Admitting: Nurse Practitioner

## 2022-12-12 VITALS — BP 122/74 | HR 72 | Temp 98.2°F | Resp 16 | Ht 69.0 in | Wt 198.4 lb

## 2022-12-12 DIAGNOSIS — E782 Mixed hyperlipidemia: Secondary | ICD-10-CM

## 2022-12-12 DIAGNOSIS — Z0000 Encounter for general adult medical examination without abnormal findings: Secondary | ICD-10-CM | POA: Diagnosis not present

## 2022-12-12 DIAGNOSIS — Z8249 Family history of ischemic heart disease and other diseases of the circulatory system: Secondary | ICD-10-CM | POA: Diagnosis not present

## 2022-12-12 DIAGNOSIS — Z131 Encounter for screening for diabetes mellitus: Secondary | ICD-10-CM

## 2022-12-12 DIAGNOSIS — Z130 Encounter for screening for diseases of the blood and blood-forming organs and certain disorders involving the immune mechanism: Secondary | ICD-10-CM

## 2022-12-17 LAB — LIPID PANEL
Cholesterol: 244 mg/dL — ABNORMAL HIGH (ref <200)
HDL: 47 mg/dL (ref >=40)
LDL Cholesterol (Calc): 167 mg/dL — ABNORMAL HIGH
Non-HDL Cholesterol (Calc): 197 mg/dL — ABNORMAL HIGH (ref <130)
Total CHOL/HDL Ratio: 5.2 (calc) — ABNORMAL HIGH (ref <5.0)
Triglycerides: 151 mg/dL — ABNORMAL HIGH (ref <150)

## 2022-12-17 LAB — CBC WITH DIFFERENTIAL/PLATELET
Absolute Monocytes: 537 {cells}/uL (ref 200–950)
Basophils Absolute: 41 {cells}/uL (ref 0–200)
Basophils Relative: 0.7 %
Eosinophils Absolute: 112 {cells}/uL (ref 15–500)
Eosinophils Relative: 1.9 %
HCT: 48.6 % (ref 38.5–50.0)
Hemoglobin: 16.7 g/dL (ref 13.2–17.1)
Lymphs Abs: 1959 {cells}/uL (ref 850–3900)
MCH: 29.1 pg (ref 27.0–33.0)
MCHC: 34.4 g/dL (ref 32.0–36.0)
MCV: 84.8 fL (ref 80.0–100.0)
MPV: 9.2 fL (ref 7.5–12.5)
Monocytes Relative: 9.1 %
Neutro Abs: 3251 {cells}/uL (ref 1500–7800)
Neutrophils Relative %: 55.1 %
Platelets: 327 10*3/uL (ref 140–400)
RBC: 5.73 10*6/uL (ref 4.20–5.80)
RDW: 12.8 % (ref 11.0–15.0)
Total Lymphocyte: 33.2 %
WBC: 5.9 10*3/uL (ref 3.8–10.8)

## 2022-12-17 LAB — COMPLETE METABOLIC PANEL WITH GFR
AG Ratio: 2 (calc) (ref 1.0–2.5)
ALT: 43 U/L (ref 9–46)
AST: 23 U/L (ref 10–40)
Albumin: 4.9 g/dL (ref 3.6–5.1)
Alkaline phosphatase (APISO): 63 U/L (ref 36–130)
BUN: 12 mg/dL (ref 7–25)
CO2: 27 mmol/L (ref 20–32)
Calcium: 10.5 mg/dL — ABNORMAL HIGH (ref 8.6–10.3)
Chloride: 103 mmol/L (ref 98–110)
Creat: 1.15 mg/dL (ref 0.60–1.26)
Globulin: 2.5 g/dL (ref 1.9–3.7)
Glucose, Bld: 93 mg/dL (ref 65–99)
Potassium: 4.6 mmol/L (ref 3.5–5.3)
Sodium: 140 mmol/L (ref 135–146)
Total Bilirubin: 0.5 mg/dL (ref 0.2–1.2)
Total Protein: 7.4 g/dL (ref 6.1–8.1)
eGFR: 87 mL/min/{1.73_m2} (ref >=60)

## 2022-12-17 LAB — LIPOPROTEIN A (LPA): Lipoprotein (a): <10 nmol/L (ref <75)

## 2022-12-17 LAB — HEMOGLOBIN A1C
Hgb A1c MFr Bld: 5.5 %{Hb} (ref <5.7)
Mean Plasma Glucose: 111 mg/dL
eAG (mmol/L): 6.2 mmol/L

## 2023-01-13 ENCOUNTER — Telehealth: Payer: 59 | Admitting: Family

## 2023-01-13 DIAGNOSIS — T63441A Toxic effect of venom of bees, accidental (unintentional), initial encounter: Secondary | ICD-10-CM | POA: Diagnosis not present

## 2023-01-13 MED ORDER — PREDNISONE 20 MG PO TABS: 40.0000 mg | ORAL_TABLET | Freq: Every day | ORAL | 0 refills | Status: AC

## 2023-01-13 NOTE — Progress Notes (Signed)
E-Visit for Insect Sting  Thank you for describing the insect sting for [place].  Here is how we plan to help!  A sting that we will treat with a short course of prednisone.  The 2 greatest risks from insect stings are allergic reaction, which can be fatal in some people and infection, which is more common and less serious.  Bees, wasps, yellow jackets, and hornets belong to a class of insects called Hymenoptera.  Most insect stings cause only minor discomfort.  Stings can happen anywhere on the body and can be painful.  Most stings are from honey bees or yellow jackets.  Fire ants can sting multiple times.  The sites of the stings are more likely to become infected.    I have sent in prednisone 40 mg by mouth daily for 5 days to the pharmacy you selected.  Please make sure that you selected a pharmacy that is open now.  What can be used to prevent Insect Stings?  Insect repellant with at least 20% DEET.  Wearing long pants and shirts with socks and shoes.  Wear dark or drab-colored clothes rather than bright colors.  Avoid using perfumes and hair sprays; these attract insects.  HOME CARE ADVICE:  1. Stinger removal: The stinger looks like a tiny black dot in the sting. Use a fingernail, credit card edge, or knife-edge to scrape it off.  Don't pull it out because it squeezes out more venom. If the stinger is below the skin surface, leave it alone.  It will be shed with normal skin healing. 2. Use cold compresses to the area of the sting for 10-20 minutes.  You may repeat this as needed to relieve symptoms of pain and swelling. 3.  For pain relief, take acetominophen 650 mg 4-6 hours as needed or ibuprofen 400 mg every 6-8 hours as needed or naproxen 250-500 mg every 12 hours as needed. 4.  You can also use hydrocortisone cream 0.5% or 1% up to 4 times daily as needed for itching. 5.  If the sting becomes very itchy, take Benadryl 25-50 mg, follow directions on box. 6.  Wash the area 2-3  times daily with antibacterial soap and warm water. 7. Call your Doctor if: Fever, a severe headache, or rash occur in the next 2 weeks. Sting area begins to look infected. Redness and swelling worsens after home treatment. Your current symptoms become worse.    MAKE SURE YOU:  Understand these instructions. Will watch your condition. Will get help right away if you are not doing well or get worse.  Thank you for choosing an e-visit.  Your e-visit answers were reviewed by a board certified advanced clinical practitioner to complete your personal care plan. Depending upon the condition, your plan could have included both over the counter or prescription medications.  Please review your pharmacy choice. Make sure the pharmacy is open so you can pick up prescription now. If there is a problem, you may contact your provider through [employer] and have the prescription routed to another pharmacy.  Your safety is important to [place]. If you have drug allergies check your prescription carefully.   For the next 24 hours you can use MyChart to ask questions about today's visit, request a non-urgent call back, or ask for a work or school excuse. You will get an email in the next two days asking about your experience. I hope that your e-visit has been valuable and will speed your recovery.  Approximately 5 minutes  was spent documenting and reviewing patient's chart.

## 2023-01-20 ENCOUNTER — Encounter: Payer: Self-pay | Admitting: Nurse Practitioner

## 2023-01-22 ENCOUNTER — Encounter: Payer: Self-pay | Admitting: Physician Assistant

## 2023-01-22 ENCOUNTER — Ambulatory Visit (INDEPENDENT_AMBULATORY_CARE_PROVIDER_SITE_OTHER): Payer: 59 | Admitting: Physician Assistant

## 2023-01-22 ENCOUNTER — Other Ambulatory Visit (HOSPITAL_COMMUNITY): Payer: Self-pay

## 2023-01-22 VITALS — BP 128/78 | HR 102 | Temp 98.1°F | Resp 16 | Ht 69.0 in | Wt 192.2 lb

## 2023-01-22 DIAGNOSIS — M545 Low back pain, unspecified: Secondary | ICD-10-CM | POA: Diagnosis not present

## 2023-01-22 DIAGNOSIS — M79604 Pain in right leg: Secondary | ICD-10-CM

## 2023-01-22 DIAGNOSIS — S76301A Unspecified injury of muscle, fascia and tendon of the posterior muscle group at thigh level, right thigh, initial encounter: Secondary | ICD-10-CM

## 2023-01-22 MED ORDER — MELOXICAM 15 MG PO TABS
15.0000 mg | ORAL_TABLET | Freq: Every day | ORAL | 0 refills | Status: DC
Start: 2023-01-22 — End: 2023-03-13
  Filled 2023-01-22: qty 30, 30d supply, fill #0

## 2023-01-22 NOTE — Patient Instructions (Signed)
  I recommend the following at this time to help relieve that discomfort:  Rest Warm compresses to the area (20 minutes on, minimum of 30 minutes off) You can alternate Tylenol and Ibuprofen for pain management but Ibuprofen is typically preferred to reduce inflammation.  Please take the Meloxicam 15 mg by mouth once per day, I'd recommend taking at night to help with your morning pain). I recommend taking with plenty of water and with food to prevent GI upset ( do not use the Meloxicam with other NSAIDs)  Gentle stretches and exercises that I have included in your paperwork Try to reduce excess strain to the area and rest as much as possible  Wear supportive shoes and, if you must lift anything, use proper lifting techniques that spare your back.   If these measures do not lead to improvement in your symptoms over the next 2-4 weeks please let us know

## 2023-01-22 NOTE — Progress Notes (Unsigned)
Acute Office Visit   Patient: Francisco Herring   DOB: March 21, 1991   32 y.o. Male  MRN: [id number] Visit Date: 01/22/2023  Today's healthcare provider: Oswaldo Conroy Pilar Corrales, PA-C  Introduced myself to the patient as a Secondary school teacher and provided education on APPs in clinical practice.    Chief Complaint  Patient presents with   Leg Pain    Right leg from hip to knee, pain has got worst, onset since 12/08/22   Subjective    HPI HPI     Leg Pain    Additional comments: Right leg from hip to knee, pain has got worst, onset since 12/08/22      Last edited by Forde Radon, CMA on 01/22/2023 10:32 AM.       Leg pain (right)  Reports he was bending down to pick up his 13lb dog and felt a popping sensation in lower back/ lower right side   Onset: sudden - happened after he pulled a back muscle  He is not sure if it was in recovery from back injury or if he sustained back and leg injury at that time  Duration: over a month  Location: right leg from hip to knee Radiation: starts in right lateral hip and radiates to lateral aspect of right knee  Pain level and character: avg 4/10 but can get up to 6-7/10 when acutely aggravated usually sharp in the AM then becomes achy pain and starts to improve with more use/ ambulation Other associated symptoms: none  He denies weakness, numbness, tingling,  He denies previous injuries to the back or legs  Interventions: Ibuprofen PRN Alleviating: walking and being upright  Aggravating: sitting and fully extending leg   States when he wakes up in the AM the muscles in his leg feel like they are ripping  He reports improvement with pain while he was Prednisone taper but did not like how he was feeling while taking it (tachycardia)     Medications: Outpatient Medications Prior to Visit  Medication Sig   Cholecalciferol (VITAMIN D3) 10 MCG (400 UNIT) CAPS Take by mouth.   Multiple Vitamin (MULTI-VITAMINS) TABS Take by mouth.   Omega-3  Fatty Acids (FISH OIL) 1000 MG CAPS Take by mouth.   No facility-administered medications prior to visit.    Review of Systems  Musculoskeletal:  Positive for gait problem and myalgias.    {Insert previous labs (optional):23779} {See past labs  Heme  Chem  Endocrine  Serology  Results Review (optional):1}   Objective    BP 128/78   Pulse (!) 102   Temp 98.1 F (36.7 C) (Oral)   Resp 16   Ht 5\' 9"  (1.753 m)   Wt 192 lb 3.2 oz (87.2 kg)   SpO2 98%   BMI 28.38 kg/m  {Insert last BP/Wt (optional):23777}{See vitals history (optional):1}   Physical Exam Vitals reviewed.  Constitutional:      General: He is awake.     Appearance: Normal appearance. He is well-developed and well-groomed.  HENT:     Head: Normocephalic and atraumatic.  Musculoskeletal:       Legs:     Comments: ROM intact from thoracic to hip Strength 5/5   Neurological:     Mental Status: He is alert.  Psychiatric:        Behavior: Behavior is cooperative.       No results found for any visits on 01/22/23.  Assessment & Plan  No follow-ups on file.        Problem List Items Addressed This Visit   None Visit Diagnoses     Right leg pain    -  Primary   Relevant Medications   meloxicam (MOBIC) 15 MG tablet   Other Relevant Orders   Ambulatory referral to Physical Therapy   Right hamstring injury, initial encounter       Relevant Orders   Ambulatory referral to Physical Therapy   Acute right-sided low back pain, unspecified whether sciatica present       Relevant Medications   meloxicam (MOBIC) 15 MG tablet   Other Relevant Orders   Ambulatory referral to Physical Therapy        No follow-ups on file.   I, Maryum Batterson E Umaiza Matusik, PA-C, have reviewed all documentation for this visit. The documentation on 01/22/23 for the exam, diagnosis, procedures, and orders are all accurate and complete.   Jacquelin Hawking, MHS, PA-C [facility]

## 2023-01-31 NOTE — Therapy (Signed)
OUTPATIENT PHYSICAL THERAPY THORACOLUMBAR EVALUATION   Patient Name: Francisco Herring MRN: [id number] DOB:20-Apr-1991, 32 y.o., male Today's Date: 02/01/2023  END OF SESSION:  PT End of Session - 02/01/23 0759     Visit Number 1    Date for PT Re-Evaluation 03/15/23    Authorization Type [facility] [employer] focus    PT Start Time 0800    PT Stop Time 0837    PT Time Calculation (min) 37 min    Activity Tolerance Patient tolerated treatment well    Behavior During Therapy WFL for tasks assessed/performed             Past Medical History:  Diagnosis Date   ADHD    Past Surgical History:  Procedure Laterality Date   TYMPANOPLASTY Bilateral    There are no problems to display for this patient.   PCP: Berniece Salines, FNP   REFERRING PROVIDER: Mecum, Oswaldo Conroy, PA-C   REFERRING DIAG:  [phone] (ICD-10-CM) - Right leg pain  S76.301A (ICD-10-CM) - Right hamstring injury, initial encounter  M54.50 (ICD-10-CM) - Acute right-sided low back pain, unspecified whether sciatica present    Rationale for Evaluation and Treatment: Rehabilitation  THERAPY DIAG:  Other low back pain  Pain in right leg  Muscle weakness (generalized)  Cramp and spasm  ONSET DATE: 12/08/22  SUBJECTIVE:                                                                                                                                                                                           SUBJECTIVE STATEMENT: Picking up his 20# dog and his back popped. Couldn't move for a day and then his leg started hurting. No back pain now. No problem when he's moving around. Sitting for 30 min makes it worse.   PERTINENT HISTORY:    PAIN:  Are you having pain? Yes: NPRS scale: 4-5/10 Pain location: R hip to just proximal to knee posterior Pain description: sharp then goes away Aggravating factors: sitting for 30 min, sit to stand Relieving factors: moving around   PRECAUTIONS: Other: sulfa allergy  RED  FLAGS: None   WEIGHT BEARING RESTRICTIONS: No  FALLS:  Has patient fallen in last 6 months? No  LIVING ENVIRONMENT: Lives with: lives with their spouse Lives in: House/apartment Stairs: No Has following equipment at home: None  OCCUPATION: [employer] supervisor  PLOF: Independent  PATIENT GOALS: not have the pain  NEXT MD VISIT: none scheduled  OBJECTIVE:  Note: Objective measures were completed at Evaluation unless otherwise noted.  DIAGNOSTIC FINDINGS:  none  PATIENT SURVEYS:  LEFS 75 / 80 = 93.8 %  COGNITION: Overall cognitive  status: Within functional limits for tasks assessed     SENSATION: WFL  MUSCLE LENGTH: Mild HS  POSTURE: rounded shoulders and forward head  PALPATION: Pain in the leg with UPA R L5/S1 and L4/5, tight R lumbar  LUMBAR ROM: Full, feels in leg with R SB, ext, flex  LOWER EXTREMITY ROM:   WNL  LOWER EXTREMITY MMT:  grossly 5/5 some weakness in R lumbar with L hip flex testing  LUMBAR SPECIAL TESTS:  Straight leg raise test: Negative, Slump test: Positive, and Quadrant test: Positive   TODAY'S TREATMENT:                                                                                                                              DATE:   02/01/23  See pt ed and HEP   PATIENT EDUCATION:  Education details: PT eval findings, anticipated POC, initial HEP, posture and body mechanics for typical daily postioning, mobility and household tasks, and role of DN  Person educated: Patient Education method: Explanation, Demonstration, and Handouts Education comprehension: verbalized understanding and returned demonstration  HOME EXERCISE PROGRAM: Access Code: [email or web address] URL: [email or web address] Date: 02/01/2023 Prepared by: Raynelle Fanning  Exercises - Static Prone on Elbows  - 1 x daily - 7 x weekly - 1 sets - 1 reps - 5-10 min hold - Prone Press Up  - 3-4 x daily - 7 x weekly - 1-3 sets - 10 reps - Standing Lumbar Extension at Wall -  Forearms  - 3-4 x daily - 7 x weekly - 1-3 sets - 10 reps - Standing Lumbar Extension  - 3-4 x daily - 7 x weekly - 1-3 sets - 10 reps - Standard Plank  - 1 x daily - 7 x weekly - 1 sets - 5 reps - max hold - Prone Hip Extension  - 1 x daily - 7 x weekly - 3 sets - 10 reps - Seated Piriformis Stretch with Trunk Bend  - 2 x daily - 7 x weekly - 1 sets - 3 reps - 30-60 sec  hold  ASSESSMENT:  CLINICAL IMPRESSION: Patient is a 32 y.o. male who was seen today for physical therapy evaluation and treatment for R posterior leg pain and low back pain. He has pain primarily in the leg with sitting, sit to stand and bending. He has pain in the back and leg with UPA mobs to L5/S1 and L4/5 and mild lumbar weakness on the R. He will benefit from skilled PT to address these deficits.    OBJECTIVE IMPAIRMENTS: decreased activity tolerance, decreased strength, hypomobility, increased muscle spasms, impaired flexibility, postural dysfunction, and pain.   ACTIVITY LIMITATIONS: bending, sitting, and squatting  PARTICIPATION LIMITATIONS: driving and occupation  PERSONAL FACTORS:  N/A  are also affecting patient's functional outcome.   REHAB POTENTIAL: Excellent  CLINICAL DECISION MAKING: Stable/uncomplicated  EVALUATION COMPLEXITY: Low   GOALS: Goals reviewed with patient? Yes  SHORT TERM GOALS: Target date: 02/15/2023  Patient will be independent with initial HEP.  Baseline:  Goal status: INITIAL  2.  Patient will report centralization of radicular symptoms.  Baseline:  Goal status: INITIAL   LONG TERM GOALS: Target date: 03/15/2023   Patient will be independent with advanced/ongoing HEP to improve outcomes and carryover.  Baseline:  Goal status: INITIAL  2.  Patient will report 95% improvement in R leg and low back pain with sitting, sit to stand and bending to improve QOL.  Baseline:  Goal status: INITIAL  3.  Patient will report 80/80 on LEFS to demonstrate improved functional  ability.  Baseline: 75 / 80 = 93.8  Goal status: INITIAL   4.  Patient to demonstrate ability to achieve and maintain good spinal alignment/posturing and body mechanics needed for daily activities. Baseline:  Goal status: INITIAL    PLAN:  PT FREQUENCY: 1x/week  PT DURATION: 6 weeks  PLANNED INTERVENTIONS: 97146- PT Re-evaluation, 97110-Therapeutic exercises, 97530- Therapeutic activity, [id number]- Neuromuscular re-education, 97535- Self Care, 40981- Manual therapy, 97014- Electrical stimulation (unattended), 19147- Traction (mechanical), Patient/Family education, Dry Needling, Spinal mobilization, Cryotherapy, and Moist heat.  PLAN FOR NEXT SESSION: assess response to extension exercises, progress functional core strength, body mechanics/ADL mods, Manual prn.   Solon Palm, PT  02/01/2023, 10:56 AM

## 2023-02-01 ENCOUNTER — Ambulatory Visit: Payer: 59 | Attending: Physician Assistant | Admitting: Physical Therapy

## 2023-02-01 ENCOUNTER — Encounter: Payer: Self-pay | Admitting: Physical Therapy

## 2023-02-01 ENCOUNTER — Other Ambulatory Visit: Payer: Self-pay

## 2023-02-01 DIAGNOSIS — S76301A Unspecified injury of muscle, fascia and tendon of the posterior muscle group at thigh level, right thigh, initial encounter: Secondary | ICD-10-CM | POA: Diagnosis not present

## 2023-02-01 DIAGNOSIS — M79604 Pain in right leg: Secondary | ICD-10-CM | POA: Insufficient documentation

## 2023-02-01 DIAGNOSIS — M5459 Other low back pain: Secondary | ICD-10-CM

## 2023-02-01 DIAGNOSIS — M545 Low back pain, unspecified: Secondary | ICD-10-CM | POA: Diagnosis not present

## 2023-02-01 DIAGNOSIS — X58XXXA Exposure to other specified factors, initial encounter: Secondary | ICD-10-CM | POA: Insufficient documentation

## 2023-02-01 DIAGNOSIS — M6281 Muscle weakness (generalized): Secondary | ICD-10-CM | POA: Diagnosis not present

## 2023-02-01 DIAGNOSIS — R252 Cramp and spasm: Secondary | ICD-10-CM

## 2023-02-06 ENCOUNTER — Ambulatory Visit: Payer: 59 | Admitting: Physical Therapy

## 2023-02-07 NOTE — Therapy (Signed)
OUTPATIENT PHYSICAL THERAPY THORACOLUMBAR TREATMENT   Patient Name: Francisco Herring MRN: [id number] DOB:1990-05-31, 32 y.o., male Today's Date: 02/08/2023  END OF SESSION:  PT End of Session - 02/08/23 0800     Visit Number 2    Date for PT Re-Evaluation 03/15/23    Authorization Type [facility] [employer] focus    PT Start Time 0800    PT Stop Time 0839    PT Time Calculation (min) 39 min    Activity Tolerance Patient tolerated treatment well    Behavior During Therapy WFL for tasks assessed/performed              Past Medical History:  Diagnosis Date   ADHD    Past Surgical History:  Procedure Laterality Date   TYMPANOPLASTY Bilateral    There are no problems to display for this patient.   PCP: Berniece Salines, FNP   REFERRING PROVIDER: Mecum, Oswaldo Conroy, PA-C   REFERRING DIAG:  [phone] (ICD-10-CM) - Right leg pain  S76.301A (ICD-10-CM) - Right hamstring injury, initial encounter  M54.50 (ICD-10-CM) - Acute right-sided low back pain, unspecified whether sciatica present    Rationale for Evaluation and Treatment: Rehabilitation  THERAPY DIAG:  Other low back pain  Pain in right leg  Muscle weakness (generalized)  Cramp and spasm  ONSET DATE: 12/08/22  SUBJECTIVE:                                                                                                                                                                                           SUBJECTIVE STATEMENT: Pain is a little worse. It's in my low back now. The press ups and prone on elbows help the most.   Eval: Picking up his 20# dog and his back popped. Couldn't move for a day and then his leg started hurting. No back pain now. No problem when he's moving around. Sitting for 30 min makes it worse.   PERTINENT HISTORY:    PAIN:  Are you having pain? Yes: NPRS scale: 1-2/10 Pain location: R hip to just proximal to knee posterior Pain description: sharp then goes away Aggravating factors: sitting  for 30 min, sit to stand Relieving factors: moving around   PRECAUTIONS: Other: sulfa allergy  RED FLAGS: None   WEIGHT BEARING RESTRICTIONS: No  FALLS:  Has patient fallen in last 6 months? No  LIVING ENVIRONMENT: Lives with: lives with their spouse Lives in: House/apartment Stairs: No Has following equipment at home: None  OCCUPATION: CT supervisor  PLOF: Independent  PATIENT GOALS: not have the pain  NEXT MD VISIT: none scheduled  OBJECTIVE:  Note: Objective measures were completed  at Evaluation unless otherwise noted.  DIAGNOSTIC FINDINGS:  none  PATIENT SURVEYS:  LEFS 75 / 80 = 93.8 %  COGNITION: Overall cognitive status: Within functional limits for tasks assessed     SENSATION: WFL  MUSCLE LENGTH: Mild HS  POSTURE: rounded shoulders and forward head  PALPATION: Pain in the leg with UPA R L5/S1 and L4/5, tight R lumbar  LUMBAR ROM: Full, feels in leg with R SB, ext, flex  LOWER EXTREMITY ROM:   WNL  LOWER EXTREMITY MMT:  grossly 5/5 some weakness in R lumbar with L hip flex testing  LUMBAR SPECIAL TESTS:  Straight leg raise test: Negative, Slump test: Positive, and Quadrant test: Positive   TODAY'S TREATMENT:                                                                                                                              DATE:   02/08/23 R UPA mobs to L5/S1 abolishes all pain, but it returns when pressure released. Prone hip ext over one pillow x 10 B Quadriped alt leg x 5, then bird dog x 5 Standing T on R LE x 10 focusing on level pelvis and chest out Standing pallof press double blue band x 10 B  Supine sciatic nerve glide 5 sec hold x 10 Supine TA + march x 5 ea, then sequental march with TA x 5 B Supine 90/90 heel taps x 10 B by the end felt across low back Attempted 90/90 leg press and felt into R leg Prone press ups x 10 Discussed ways to avoid small bend with ADLS - throwing leg back or stepping up fwd under counter;  also keeping chest out  02/01/23  See pt ed and HEP   PATIENT EDUCATION:  Education details: HEP update and role of DN  Person educated: Patient Education method: Explanation, Demonstration, and Handouts Education comprehension: verbalized understanding and returned demonstration  HOME EXERCISE PROGRAM: Access Code: [email or web address] URL: [email or web address] Date: 02/01/2023 Prepared by: Raynelle Fanning  Exercises - Static Prone on Elbows  - 1 x daily - 7 x weekly - 1 sets - 1 reps - 5-10 min hold - Prone Press Up  - 3-4 x daily - 7 x weekly - 1-3 sets - 10 reps - Standing Lumbar Extension at Wall - Forearms  - 3-4 x daily - 7 x weekly - 1-3 sets - 10 reps - Standing Lumbar Extension  - 3-4 x daily - 7 x weekly - 1-3 sets - 10 reps - Standard Plank  - 1 x daily - 7 x weekly - 1 sets - 5 reps - max hold - Prone Hip Extension  - 1 x daily - 7 x weekly - 3 sets - 10 reps - Seated Piriformis Stretch with Trunk Bend  - 2 x daily - 7 x weekly - 1 sets - 3 reps - 30-60 sec  hold  ASSESSMENT:  CLINICAL IMPRESSION: [name] presents reporting increased symptoms now  into his back almost constantly. At eval he was only feeling into leg. Extension protocol abolishes his pain as does UPA mobs to L5/S1. Focused on core strength today and progressed HEP accordingly. [name] continues to demonstrate potential for improvement and would benefit from continued skilled therapy to address impairments.    Patient is a 32 y.o. male who was seen today for physical therapy evaluation and treatment for R posterior leg pain and low back pain. He has pain primarily in the leg with sitting, sit to stand and bending. He has pain in the back and leg with UPA mobs to L5/S1 and L4/5 and mild lumbar weakness on the R. He will benefit from skilled PT to address these deficits.    OBJECTIVE IMPAIRMENTS: decreased activity tolerance, decreased strength, hypomobility, increased muscle spasms, impaired flexibility, postural  dysfunction, and pain.   ACTIVITY LIMITATIONS: bending, sitting, and squatting  PARTICIPATION LIMITATIONS: driving and occupation  PERSONAL FACTORS:  N/A  are also affecting patient's functional outcome.   REHAB POTENTIAL: Excellent  CLINICAL DECISION MAKING: Stable/uncomplicated  EVALUATION COMPLEXITY: Low   GOALS: Goals reviewed with patient? Yes  SHORT TERM GOALS: Target date: 02/15/2023   Patient will be independent with initial HEP.  Baseline:  Goal status: INITIAL  2.  Patient will report centralization of radicular symptoms.  Baseline:  Goal status: INITIAL   LONG TERM GOALS: Target date: 03/15/2023   Patient will be independent with advanced/ongoing HEP to improve outcomes and carryover.  Baseline:  Goal status: INITIAL  2.  Patient will report 95% improvement in R leg and low back pain with sitting, sit to stand and bending to improve QOL.  Baseline:  Goal status: INITIAL  3.  Patient will report 80/80 on LEFS to demonstrate improved functional ability.  Baseline: 75 / 80 = 93.8  Goal status: INITIAL   4.  Patient to demonstrate ability to achieve and maintain good spinal alignment/posturing and body mechanics needed for daily activities. Baseline:  Goal status: INITIAL    PLAN:  PT FREQUENCY: 1x/week  PT DURATION: 6 weeks  PLANNED INTERVENTIONS: 97146- PT Re-evaluation, 97110-Therapeutic exercises, 97530- Therapeutic activity, [id number]- Neuromuscular re-education, 97535- Self Care, 40981- Manual therapy, 97014- Electrical stimulation (unattended), 19147- Traction (mechanical), Patient/Family education, Dry Needling, Spinal mobilization, Cryotherapy, and Moist heat.  PLAN FOR NEXT SESSION: assess response to extension exercises, progress functional core strength, body mechanics/ADL mods, Manual prn.   Solon Palm, PT  02/08/2023, 8:43 AM

## 2023-02-08 ENCOUNTER — Ambulatory Visit: Payer: 59 | Admitting: Physical Therapy

## 2023-02-08 ENCOUNTER — Encounter: Payer: Self-pay | Admitting: Physical Therapy

## 2023-02-08 DIAGNOSIS — M5459 Other low back pain: Secondary | ICD-10-CM

## 2023-02-08 DIAGNOSIS — M6281 Muscle weakness (generalized): Secondary | ICD-10-CM

## 2023-02-08 DIAGNOSIS — R252 Cramp and spasm: Secondary | ICD-10-CM

## 2023-02-08 DIAGNOSIS — M79604 Pain in right leg: Secondary | ICD-10-CM

## 2023-02-08 DIAGNOSIS — M545 Low back pain, unspecified: Secondary | ICD-10-CM | POA: Diagnosis not present

## 2023-02-08 DIAGNOSIS — S76301A Unspecified injury of muscle, fascia and tendon of the posterior muscle group at thigh level, right thigh, initial encounter: Secondary | ICD-10-CM | POA: Diagnosis not present

## 2023-02-13 ENCOUNTER — Ambulatory Visit: Payer: 59 | Admitting: Physical Therapy

## 2023-02-13 ENCOUNTER — Encounter: Payer: Self-pay | Admitting: Physical Therapy

## 2023-02-13 DIAGNOSIS — R252 Cramp and spasm: Secondary | ICD-10-CM

## 2023-02-13 DIAGNOSIS — M5459 Other low back pain: Secondary | ICD-10-CM

## 2023-02-13 DIAGNOSIS — M79604 Pain in right leg: Secondary | ICD-10-CM

## 2023-02-13 DIAGNOSIS — M6281 Muscle weakness (generalized): Secondary | ICD-10-CM

## 2023-02-13 DIAGNOSIS — M545 Low back pain, unspecified: Secondary | ICD-10-CM | POA: Diagnosis not present

## 2023-02-13 DIAGNOSIS — S76301A Unspecified injury of muscle, fascia and tendon of the posterior muscle group at thigh level, right thigh, initial encounter: Secondary | ICD-10-CM | POA: Diagnosis not present

## 2023-02-13 NOTE — Therapy (Signed)
OUTPATIENT PHYSICAL THERAPY THORACOLUMBAR TREATMENT   Patient Name: Francisco Herring MRN: [id number] DOB:[birth year], 32 y.o., male Today's Date: 02/13/2023  END OF SESSION:  PT End of Session - 02/13/23 0843     Visit Number 3    Date for PT Re-Evaluation 03/15/23    Authorization Type [facility] [employer] focus    PT Start Time 0800    PT Stop Time 0842    PT Time Calculation (min) 42 min    Activity Tolerance Patient tolerated treatment well    Behavior During Therapy WFL for tasks assessed/performed               Past Medical History:  Diagnosis Date   ADHD    Past Surgical History:  Procedure Laterality Date   TYMPANOPLASTY Bilateral    There are no problems to display for this patient.   PCP: Berniece Salines, FNP   REFERRING PROVIDER: Mecum, Oswaldo Conroy, PA-C   REFERRING DIAG:  [phone] (ICD-10-CM) - Right leg pain  S76.301A (ICD-10-CM) - Right hamstring injury, initial encounter  M54.50 (ICD-10-CM) - Acute right-sided low back pain, unspecified whether sciatica present    Rationale for Evaluation and Treatment: Rehabilitation  THERAPY DIAG:  Other low back pain  Pain in right leg  Muscle weakness (generalized)  Cramp and spasm  ONSET DATE: 12/08/22  SUBJECTIVE:                                                                                                                                                                                           SUBJECTIVE STATEMENT: Patient reports he is doing okay today. His back is not currently hurting but he is having 3-4/10 pain in his leg. The prone exercises have been helping his pain. Form modification when moving patient has helped his back pain at work.  Eval: Picking up his 20# dog and his back popped. Couldn't move for a day and then his leg started hurting. No back pain now. No problem when he's moving around. Sitting for 30 min makes it worse.   PERTINENT HISTORY:    PAIN: 02/13/2023 Are you having pain? Yes:  NPRS scale: 3-4/10 Pain location: R hip to just proximal to knee posterior Pain description: sharp then goes away Aggravating factors: sitting for 30 min, sit to stand Relieving factors: moving around   PRECAUTIONS: Other: sulfa allergy  RED FLAGS: None   WEIGHT BEARING RESTRICTIONS: No  FALLS:  Has patient fallen in last 6 months? No  LIVING ENVIRONMENT: Lives with: lives with their spouse Lives in: House/apartment Stairs: No Has following equipment at home: None  OCCUPATION: CT supervisor  PLOF: Independent  PATIENT GOALS: not have the pain  NEXT MD VISIT: none scheduled  OBJECTIVE:  Note: Objective measures were completed at Evaluation unless otherwise noted.  DIAGNOSTIC FINDINGS:  none  PATIENT SURVEYS:  LEFS 75 / 80 = 93.8 %  COGNITION: Overall cognitive status: Within functional limits for tasks assessed     SENSATION: WFL  MUSCLE LENGTH: Mild HS  POSTURE: rounded shoulders and forward head  PALPATION: Pain in the leg with UPA R L5/S1 and L4/5, tight R lumbar  LUMBAR ROM: Full, feels in leg with R SB, ext, flex  LOWER EXTREMITY ROM:   WNL  LOWER EXTREMITY MMT:  grossly 5/5 some weakness in R lumbar with L hip flex testing  LUMBAR SPECIAL TESTS:  Straight leg raise test: Negative, Slump test: Positive, and Quadrant test: Positive   TODAY'S TREATMENT:                                                                                                                              DATE:   02/13/23 NuStep Level 4 6 minutes- PT present to discuss status Prone hip ext over one pillow x 10 B Quadriped alt leg x 10, then bird dog x 10 Standing T on R LE x 10 focusing on level pelvis and chest out Resisted turning blue TB x 10  Pallof Press at cable column 10# x 10 each way Chops at cable column 10# x 10 each way Standing march with 2 sec hold & unilateral 10lb KB x 10 each Supine sciatic nerve glide 5 sec hold x 10 Supine Alt hand & knee press x  10 Supine 90/90 heel taps x 10 B  Prone press ups x 10 Hip Matrix Extension 35# 2 x 10 each    02/08/23 R UPA mobs to L5/S1 abolishes all pain, but it returns when pressure released. Prone hip ext over one pillow x 10 B Quadriped alt leg x 5, then bird dog x 5 Standing T on R LE x 10 focusing on level pelvis and chest out Standing pallof press double blue band x 10 B  Supine sciatic nerve glide 5 sec hold x 10 Supine TA + march x 5 ea, then sequental march with TA x 5 B Supine 90/90 heel taps x 10 B by the end felt across low back Attempted 90/90 leg press and felt into R leg Prone press ups x 10 Discussed ways to avoid small bend with ADLS - throwing leg back or stepping up fwd under counter; also keeping chest out  02/01/23  See pt ed and HEP   PATIENT EDUCATION:  Education details: HEP update and role of DN  Person educated: Patient Education method: Explanation, Demonstration, and Handouts Education comprehension: verbalized understanding and returned demonstration  HOME EXERCISE PROGRAM: Access Code: [email or web address] URL: [email or web address] Date: 02/01/2023 Prepared by: Raynelle Fanning  Exercises - Static Prone on Elbows  - 1 x daily - 7 x weekly - 1 sets - 1  reps - 5-10 min hold - Prone Press Up  - 3-4 x daily - 7 x weekly - 1-3 sets - 10 reps - Standing Lumbar Extension at Wall - Forearms  - 3-4 x daily - 7 x weekly - 1-3 sets - 10 reps - Standing Lumbar Extension  - 3-4 x daily - 7 x weekly - 1-3 sets - 10 reps - Standard Plank  - 1 x daily - 7 x weekly - 1 sets - 5 reps - max hold - Prone Hip Extension  - 1 x daily - 7 x weekly - 3 sets - 10 reps - Seated Piriformis Stretch with Trunk Bend  - 2 x daily - 7 x weekly - 1 sets - 3 reps - 30-60 sec  hold  ASSESSMENT:  CLINICAL IMPRESSION: Today's treatment session focused on core strengthening and lumbar mobility. Since starting therapy patient is able to work longer before leg pain begins. Patient reports feel 20%  better since starting therapy. Modifications to his lifting technique at job has been beneficial and he experiences less pain. Patient tolerated treatment session well. Required moderate verbal cues for form correction while performing standing T. Patient had a mild increase in pain while performing standing T exercise. At end of treatment session patient verbalized no leg pain. Patient will benefit from skilled PT to address the below impairments and improve overall function.     OBJECTIVE IMPAIRMENTS: decreased activity tolerance, decreased strength, hypomobility, increased muscle spasms, impaired flexibility, postural dysfunction, and pain.   ACTIVITY LIMITATIONS: bending, sitting, and squatting  PARTICIPATION LIMITATIONS: driving and occupation  PERSONAL FACTORS:  N/A  are also affecting patient's functional outcome.   REHAB POTENTIAL: Excellent  CLINICAL DECISION MAKING: Stable/uncomplicated  EVALUATION COMPLEXITY: Low   GOALS: Goals reviewed with patient? Yes  SHORT TERM GOALS: Target date: 02/15/2023   Patient will be independent with initial HEP.  Baseline:  Goal status: INITIAL  2.  Patient will report centralization of radicular symptoms.  Baseline:  Goal status: INITIAL   LONG TERM GOALS: Target date: 03/15/2023   Patient will be independent with advanced/ongoing HEP to improve outcomes and carryover.  Baseline:  Goal status: INITIAL  2.  Patient will report 95% improvement in R leg and low back pain with sitting, sit to stand and bending to improve QOL.  Baseline:  Goal status: INITIAL  3.  Patient will report 80/80 on LEFS to demonstrate improved functional ability.  Baseline: 75 / 80 = 93.8  Goal status: INITIAL   4.  Patient to demonstrate ability to achieve and maintain good spinal alignment/posturing and body mechanics needed for daily activities. Baseline:  Goal status: INITIAL    PLAN:  PT FREQUENCY: 1x/week  PT DURATION: 6 weeks  PLANNED  INTERVENTIONS: 97146- PT Re-evaluation, 97110-Therapeutic exercises, 97530- Therapeutic activity, [id number]- Neuromuscular re-education, 97535- Self Care, 16109- Manual therapy, 97014- Electrical stimulation (unattended), 60454- Traction (mechanical), Patient/Family education, Dry Needling, Spinal mobilization, Cryotherapy, and Moist heat.  PLAN FOR NEXT SESSION: assess response to treatment session; continue progressing core exercise   Claude Manges, PT 02/13/23 8:44 AM

## 2023-02-19 ENCOUNTER — Ambulatory Visit: Payer: 59 | Attending: Internal Medicine | Admitting: Internal Medicine

## 2023-02-19 ENCOUNTER — Other Ambulatory Visit (HOSPITAL_COMMUNITY): Payer: Self-pay

## 2023-02-19 ENCOUNTER — Encounter: Payer: Self-pay | Admitting: Internal Medicine

## 2023-02-19 VITALS — BP 126/86 | HR 82 | Ht 69.0 in | Wt 194.0 lb

## 2023-02-19 DIAGNOSIS — Z8249 Family history of ischemic heart disease and other diseases of the circulatory system: Secondary | ICD-10-CM

## 2023-02-19 DIAGNOSIS — E782 Mixed hyperlipidemia: Secondary | ICD-10-CM | POA: Diagnosis not present

## 2023-02-19 MED ORDER — ATORVASTATIN CALCIUM 10 MG PO TABS
10.0000 mg | ORAL_TABLET | Freq: Every day | ORAL | 3 refills | Status: DC
Start: 2023-02-19 — End: 2023-05-14
  Filled 2023-02-19 (×2): qty 90, 90d supply, fill #0

## 2023-02-19 NOTE — Progress Notes (Signed)
  Cardiology Office Note:  .   Date:  02/19/2023  ID:  Francisco Herring, DOB 07-15-1990, MRN [id number] PCP: Berniece Salines, FNP  [facility] Providers Cardiologist:  None    History of Present Illness: Marland Kitchen   Francisco Herring is a 32 y.o. male.  Discussed the use of AI scribe software for clinical note transcription with the patient, who gave verbal consent to proceed.  History of Present Illness   The patient, with a strong family history of heart disease and high cholesterol, presents for cardiovascular evaluation. The patient's mother has a history of heart problems and a stent, and her father has high cholesterol. The patient's grandfather underwent a triple bypass in his thirties or forties. The patient has high cholesterol but no current symptoms of heart disease. The patient also experiences migraines and has a history of back pain. The patient's current medications include meloxicam, which he rarely takes. The patient is considering starting a statin for cholesterol management.        ROS: negative except per HPI above.  Studies Reviewed: Marland Kitchen   EKG Interpretation Date/Time:  Tuesday February 19 2023 08:57:43 EDT Ventricular Rate:  82 PR Interval:  150 QRS Duration:  80 QT Interval:  352 QTC Calculation: 411 R Axis:   24  Text Interpretation: Normal sinus rhythm Normal ECG No previous ECGs available Confirmed by Weston Brass ([id number]) on 02/19/2023 9:00:29 AM    Results   LABS LDL: 167 mg/dL (62/1308) Triglycerides: 151 mg/dL (65/7846) LFTs: Normal (11/2022) Creatinine: 1.15 mg/dL (96/2952) GFR: Normal (11/2022)  RADIOLOGY Calcium Score: 0 (11/2022)  DIAGNOSTIC EKG: Normal     Risk Assessment/Calculations:             Physical Exam:   VS:  BP 126/86 (BP Location: Left Arm, Patient Position: Sitting, Cuff Size: Normal)   Pulse 82   Ht 5\' 9"  (1.753 m)   Wt 194 lb (88 kg)   BMI 28.65 kg/m    Wt Readings from Last 3 Encounters:  02/19/23 194 lb (88 kg)   01/22/23 192 lb 3.2 oz (87.2 kg)  12/12/22 198 lb 6.4 oz (90 kg)     Physical Exam   CHEST: Lungs clear to auscultation bilaterally. CARDIOVASCULAR: Heart sounds normal with regular rate and rhythm.     GEN: Well nourished, well developed in no acute distress NECK: No JVD; No carotid bruits CARDIAC: RRR, no murmurs, rubs, gallops RESPIRATORY:  Clear to auscultation without rales, wheezing or rhonchi  ABDOMEN: Soft, non-tender, non-distended EXTREMITIES:  No edema; No deformity   ASSESSMENT AND PLAN: .    1. Mixed hyperlipidemia   2. Family history of premature CAD     Assessment and Plan    Hypercholesterolemia Family history of CAD LDL elevated at 167 with a strong family history of coronary artery disease. Calcium score is zero. Discussed the genetic component of cholesterol and the potential need for statin therapy. -Start Atorvastatin 10mg  daily. -Check liver function tests and repeat lipids in 3 months. -Order Lipoprotein A (LPA) at next visit.  General Health Maintenance -Continue hydration and consider dietary modifications. -Continue monitoring for any chest discomfort or other cardiac symptoms. -Follow-up in 3 months.              Weston Brass, MD, [facility] [place]  [facility]

## 2023-02-19 NOTE — Patient Instructions (Signed)
Medication Instructions:  Start: Atorvastatin (Lipitor) 10 mg Once Daily  *If you need a refill on your cardiac medications before your next appointment, please call your pharmacy*   Lab Work: Lipid panel and Liver Panel and LPa (Lipoprotein A) in 3 months prior to your next office visit. If you have labs (blood work) drawn today and your tests are completely normal, you will receive your results only by: MyChart Message (if you have MyChart) OR A paper copy in the mail If you have any lab test that is abnormal or we need to change your treatment, we will call you to review the results.   Follow-Up: At [facility], you and your health needs are our priority.  As part of our continuing mission to provide you with exceptional heart care, we have created designated Provider Care Teams.  These Care Teams include your primary Cardiologist (physician) and Advanced Practice Providers (APPs -  Physician Assistants and Nurse Practitioners) who all work together to provide you with the care you need, when you need it.  Your next appointment:   3 month(s)  Provider:   Dr. Weston Brass

## 2023-02-20 ENCOUNTER — Encounter: Payer: Self-pay | Admitting: Physical Therapy

## 2023-02-20 ENCOUNTER — Ambulatory Visit: Payer: 59 | Admitting: Physical Therapy

## 2023-02-20 DIAGNOSIS — M79604 Pain in right leg: Secondary | ICD-10-CM | POA: Diagnosis not present

## 2023-02-20 DIAGNOSIS — S76301A Unspecified injury of muscle, fascia and tendon of the posterior muscle group at thigh level, right thigh, initial encounter: Secondary | ICD-10-CM | POA: Diagnosis not present

## 2023-02-20 DIAGNOSIS — M6281 Muscle weakness (generalized): Secondary | ICD-10-CM

## 2023-02-20 DIAGNOSIS — R252 Cramp and spasm: Secondary | ICD-10-CM

## 2023-02-20 DIAGNOSIS — M545 Low back pain, unspecified: Secondary | ICD-10-CM | POA: Diagnosis not present

## 2023-02-20 DIAGNOSIS — M5459 Other low back pain: Secondary | ICD-10-CM

## 2023-02-20 NOTE — Therapy (Signed)
OUTPATIENT PHYSICAL THERAPY THORACOLUMBAR TREATMENT   Patient Name: Francisco Herring MRN: [id number] DOB:Apr 17, 1991, 32 y.o., male Today's Date: 02/20/2023  END OF SESSION:  PT End of Session - 02/20/23 0846     Visit Number 4    Date for PT Re-Evaluation 03/15/23    Authorization Type [facility] [employer] focus    PT Start Time 0757    PT Stop Time 0843    PT Time Calculation (min) 46 min    Activity Tolerance Patient tolerated treatment well    Behavior During Therapy [facility] for tasks assessed/performed                Past Medical History:  Diagnosis Date   ADHD    Past Surgical History:  Procedure Laterality Date   TYMPANOPLASTY Bilateral    There are no problems to display for this patient.   PCP: Berniece Salines, FNP   REFERRING PROVIDER: Mecum, Oswaldo Conroy, PA-C   REFERRING DIAG:  [phone] (ICD-10-CM) - Right leg pain  S76.301A (ICD-10-CM) - Right hamstring injury, initial encounter  M54.50 (ICD-10-CM) - Acute right-sided low back pain, unspecified whether sciatica present    Rationale for Evaluation and Treatment: Rehabilitation  THERAPY DIAG:  Other low back pain  Pain in right leg  Muscle weakness (generalized)  Cramp and spasm  ONSET DATE: 12/08/22  SUBJECTIVE:                                                                                                                                                                                           SUBJECTIVE STATEMENT: Patient reports he is doing good today. He is currently having leg pain but no back pain. 3/10 leg pain. His leg has been bothering him a bit more this week.  Eval: Picking up his 20# dog and his back popped. Couldn't move for a day and then his leg started hurting. No back pain now. No problem when he's moving around. Sitting for 30 min makes it worse.   PERTINENT HISTORY:    PAIN: 02/13/2023 Are you having pain? Yes: NPRS scale: 3-4/10 Pain location: R hip to just proximal to knee  posterior Pain description: sharp then goes away Aggravating factors: sitting for 30 min, sit to stand Relieving factors: moving around   PRECAUTIONS: Other: sulfa allergy  RED FLAGS: None   WEIGHT BEARING RESTRICTIONS: No  FALLS:  Has patient fallen in last 6 months? No  LIVING ENVIRONMENT: Lives with: lives with their spouse Lives in: House/apartment Stairs: No Has following equipment at home: None  OCCUPATION: CT supervisor  PLOF: Independent  PATIENT GOALS: not have the pain  NEXT MD  VISIT: none scheduled  OBJECTIVE:  Note: Objective measures were completed at Evaluation unless otherwise noted.  DIAGNOSTIC FINDINGS:  none  PATIENT SURVEYS:  LEFS 75 / 80 = 93.8 %  COGNITION: Overall cognitive status: Within functional limits for tasks assessed     SENSATION: WFL  MUSCLE LENGTH: Mild HS  POSTURE: rounded shoulders and forward head  PALPATION: Pain in the leg with UPA R L5/S1 and L4/5, tight R lumbar  LUMBAR ROM: Full, feels in leg with R SB, ext, flex  LOWER EXTREMITY ROM:   WNL  LOWER EXTREMITY MMT:  grossly 5/5 some weakness in R lumbar with L hip flex testing  LUMBAR SPECIAL TESTS:  Straight leg raise test: Negative, Slump test: Positive, and Quadrant test: Positive   TODAY'S TREATMENT:                                                                                                                              DATE:   02/20/23 Recumbent bike 6 minutes- PT present to discuss status R UPA mobs to L5/S1 -patient had pain with Grade IV that went down leg ; he tolerated Grade III Prone press ups x 10 Prone hip ext over one pillow x 10 B Bird dog 2 x 10 Supine 90/90 heel taps x 10 B  Supine heel slides x 10 each Standing T on R LE 2 x 10 focusing on level pelvis and chest out Resisted turning cable column  x 10  Pallof Press at cable column 10# x 20 each way Chops at cable column 10# x 20 each way Standing march with 2 sec hold & unilateral  10lb KB x 10 each Seated hinging x 10  Standing hinging against wall x 10    02/13/23 NuStep Level 4 6 minutes- PT present to discuss status Prone hip ext over one pillow x 10 B Quadriped alt leg x 10, then bird dog x 10 Standing T on R LE x 10 focusing on level pelvis and chest out Resisted turning blue TB x 10  Pallof Press at cable column 10# x 10 each way Chops at cable column 10# x 10 each way Standing march with 2 sec hold & unilateral 10lb KB x 10 each Supine sciatic nerve glide 5 sec hold x 10 Supine Alt hand & knee press x 10 Supine 90/90 heel taps x 10 B  Prone press ups x 10 Hip Matrix Extension 35# 2 x 10 each    02/08/23 R UPA mobs to L5/S1 abolishes all pain, but it returns when pressure released. Prone hip ext over one pillow x 10 B Quadriped alt leg x 5, then bird dog x 5 Standing T on R LE x 10 focusing on level pelvis and chest out Standing pallof press double blue band x 10 B  Supine sciatic nerve glide 5 sec hold x 10 Supine TA + march x 5 ea, then sequental march with TA x 5 B  Supine 90/90 heel taps x 10 B by the end felt across low back Attempted 90/90 leg press and felt into R leg Prone press ups x 10 Discussed ways to avoid small bend with ADLS - throwing leg back or stepping up fwd under counter; also keeping chest out   PATIENT EDUCATION:  Education details: HEP update and role of DN  Person educated: Patient Education method: Explanation, Demonstration, and Handouts Education comprehension: verbalized understanding and returned demonstration  HOME EXERCISE PROGRAM: Access Code: [email or web address] URL: [email or web address] Date: 02/01/2023 Prepared by: Raynelle Fanning  Exercises - Static Prone on Elbows  - 1 x daily - 7 x weekly - 1 sets - 1 reps - 5-10 min hold - Prone Press Up  - 3-4 x daily - 7 x weekly - 1-3 sets - 10 reps - Standing Lumbar Extension at Wall - Forearms  - 3-4 x daily - 7 x weekly - 1-3 sets - 10 reps - Standing Lumbar  Extension  - 3-4 x daily - 7 x weekly - 1-3 sets - 10 reps - Standard Plank  - 1 x daily - 7 x weekly - 1 sets - 5 reps - max hold - Prone Hip Extension  - 1 x daily - 7 x weekly - 3 sets - 10 reps - Seated Piriformis Stretch with Trunk Bend  - 2 x daily - 7 x weekly - 1 sets - 3 reps - 30-60 sec  hold  ASSESSMENT:  CLINICAL IMPRESSION: Rodny has been experiencing a little more leg pain throughout the day since last treatment session. If his leg is really painful the exercises are not that helpful in alleviating his pain. Patient did not find relief with Grade IV  UPA job mobs this treatment session he had increased pain that radiated down his leg. He tolerated Grade III job mobs without any increased pain. Patient tolerated increased intensity of core exercises without increased pain. Educated patient on POC and time frame of healing based on his symptoms. Patient will benefit from skilled PT to address the below impairments and improve overall function.    OBJECTIVE IMPAIRMENTS: decreased activity tolerance, decreased strength, hypomobility, increased muscle spasms, impaired flexibility, postural dysfunction, and pain.   ACTIVITY LIMITATIONS: bending, sitting, and squatting  PARTICIPATION LIMITATIONS: driving and occupation  PERSONAL FACTORS:  N/A  are also affecting patient's functional outcome.   REHAB POTENTIAL: Excellent  CLINICAL DECISION MAKING: Stable/uncomplicated  EVALUATION COMPLEXITY: Low   GOALS: Goals reviewed with patient? Yes  SHORT TERM GOALS: Target date: 02/15/2023   Patient will be independent with initial HEP.  Baseline:  Goal status: MET 02/20/2023  2.  Patient will report centralization of radicular symptoms.  Baseline:  Goal status: IN PROGRESS 02/20/2023   LONG TERM GOALS: Target date: 03/15/2023   Patient will be independent with advanced/ongoing HEP to improve outcomes and carryover.  Baseline:  Goal status: INITIAL  2.  Patient will report  95% improvement in R leg and low back pain with sitting, sit to stand and bending to improve QOL.  Baseline:  Goal status: INITIAL  3.  Patient will report 80/80 on LEFS to demonstrate improved functional ability.  Baseline: 75 / 80 = 93.8  Goal status: INITIAL   4.  Patient to demonstrate ability to achieve and maintain good spinal alignment/posturing and body mechanics needed for daily activities. Baseline:  Goal status: INITIAL    PLAN:  PT FREQUENCY: 1x/week  PT DURATION: 6 weeks  PLANNED INTERVENTIONS: 97146- PT Re-evaluation, 97110-Therapeutic exercises,  53664- Therapeutic activity, [id number]- Neuromuscular re-education, [id number]- Self Care, 40347- Manual therapy, 97014- Electrical stimulation (unattended), [id number]- Traction (mechanical), Patient/Family education, Dry Needling, Spinal mobilization, Cryotherapy, and Moist heat.  PLAN FOR NEXT SESSION: lumbar traction; piriformis stretch ; continue core & hinging exercises   Claude Manges, PT 02/20/23 8:47 AM

## 2023-02-27 ENCOUNTER — Encounter: Payer: Self-pay | Admitting: Physical Therapy

## 2023-02-27 ENCOUNTER — Ambulatory Visit: Payer: 59 | Attending: Physician Assistant | Admitting: Physical Therapy

## 2023-02-27 DIAGNOSIS — R252 Cramp and spasm: Secondary | ICD-10-CM | POA: Diagnosis not present

## 2023-02-27 DIAGNOSIS — M5459 Other low back pain: Secondary | ICD-10-CM | POA: Diagnosis not present

## 2023-02-27 DIAGNOSIS — M6281 Muscle weakness (generalized): Secondary | ICD-10-CM | POA: Insufficient documentation

## 2023-02-27 DIAGNOSIS — M79604 Pain in right leg: Secondary | ICD-10-CM | POA: Insufficient documentation

## 2023-02-27 NOTE — Therapy (Signed)
OUTPATIENT PHYSICAL THERAPY THORACOLUMBAR TREATMENT   Patient Name: Francisco Herring MRN: [id number] DOB:16-Oct-1990, 32 y.o., male Today's Date: 02/27/2023  END OF SESSION:  PT End of Session - 02/27/23 0844     Visit Number 5    Date for PT Re-Evaluation 03/15/23    Authorization Type [facility] [employer] focus    PT Start Time 0800    PT Stop Time 0842    PT Time Calculation (min) 42 min    Activity Tolerance Patient tolerated treatment well    Behavior During Therapy WFL for tasks assessed/performed                 Past Medical History:  Diagnosis Date   ADHD    Past Surgical History:  Procedure Laterality Date   TYMPANOPLASTY Bilateral    There are no problems to display for this patient.   PCP: Berniece Salines, FNP   REFERRING PROVIDER: Mecum, Oswaldo Conroy, PA-C   REFERRING DIAG:  [phone] (ICD-10-CM) - Right leg pain  S76.301A (ICD-10-CM) - Right hamstring injury, initial encounter  M54.50 (ICD-10-CM) - Acute right-sided low back pain, unspecified whether sciatica present    Rationale for Evaluation and Treatment: Rehabilitation  THERAPY DIAG:  Other low back pain  Pain in right leg  Muscle weakness (generalized)  Cramp and spasm  ONSET DATE: 12/08/22  SUBJECTIVE:                                                                                                                                                                                           SUBJECTIVE STATEMENT: Patient reports he is doing okay today. His Rt leg pain as started to go down to his calf now. He feels an achy pain in his lateral hip area   Eval: Picking up his 20# dog and his back popped. Couldn't move for a day and then his leg started hurting. No back pain now. No problem when he's moving around. Sitting for 30 min makes it worse.   PERTINENT HISTORY:    PAIN: 02/27/2023 Are you having pain? Yes: NPRS scale: 3-4/10 Pain location: R hip to just proximal to knee posterior Pain  description: sharp then goes away Aggravating factors: sitting for 30 min, sit to stand Relieving factors: moving around   PRECAUTIONS: Other: sulfa allergy  RED FLAGS: None   WEIGHT BEARING RESTRICTIONS: No  FALLS:  Has patient fallen in last 6 months? No  LIVING ENVIRONMENT: Lives with: lives with their spouse Lives in: House/apartment Stairs: No Has following equipment at home: None  OCCUPATION: CT supervisor  PLOF: Independent  PATIENT GOALS: not have the pain  NEXT  MD VISIT: none scheduled  OBJECTIVE:  Note: Objective measures were completed at Evaluation unless otherwise noted.  DIAGNOSTIC FINDINGS:  none  PATIENT SURVEYS:  LEFS 75 / 80 = 93.8 %  COGNITION: Overall cognitive status: Within functional limits for tasks assessed     SENSATION: WFL  MUSCLE LENGTH: Mild HS  POSTURE: rounded shoulders and forward head  PALPATION: Pain in the leg with UPA R L5/S1 and L4/5, tight R lumbar  LUMBAR ROM: Full, feels in leg with R SB, ext, flex  LOWER EXTREMITY ROM:   WNL  LOWER EXTREMITY MMT:  grossly 5/5 some weakness in R lumbar with L hip flex testing  LUMBAR SPECIAL TESTS:  Straight leg raise test: Negative, Slump test: Positive, and Quadrant test: Positive   TODAY'S TREATMENT:                                                                                                                              DATE:   02/27/23 Recumbent bike 6 minutes Level 2 - PT present to discuss status Calf stretch on rocker board 3 x 30 Supine figure four with hip flexion 3 x 30 sec Single knee to chest stretch 2 x 30 sec Supine 90/90 heel taps x 10 B  Supine 90/90 Knee extension x 10 bilateral Bird dog 2 x 10 Standing pigeon over plinth 2 x 30 sec Pallof Press at cable column 10# x 20 each way Chops at cable column 10# x 20 each way Standing march with 2 sec hold & unilateral 10lb KB x 10 each Unilateral [employer]'s [employer]ry 30lb x 4    02/20/23 Recumbent bike 6  minutes- PT present to discuss status R UPA mobs to L5/S1 -patient had pain with Grade IV that went down leg ; he tolerated Grade III Prone press ups x 10 Prone hip ext over one pillow x 10 B Bird dog 2 x 10 Supine 90/90 heel taps x 10 B  Supine heel slides x 10 each Standing T on R LE 2 x 10 focusing on level pelvis and chest out Resisted turning cable column  x 10  Pallof Press at cable column 10# x 20 each way Chops at cable column 10# x 20 each way Standing march with 2 sec hold & unilateral 10lb KB x 10 each Seated hinging x 10  Standing hinging against wall x 10    02/13/23 NuStep Level 4 6 minutes- PT present to discuss status Prone hip ext over one pillow x 10 B Quadriped alt leg x 10, then bird dog x 10 Standing T on R LE x 10 focusing on level pelvis and chest out Resisted turning blue TB x 10  Pallof Press at cable column 10# x 10 each way Chops at cable column 10# x 10 each way Standing march with 2 sec hold & unilateral 10lb KB x 10 each Supine sciatic nerve glide 5 sec hold x 10 Supine Alt hand &  knee press x 10 Supine 90/90 heel taps x 10 B  Prone press ups x 10 Hip Matrix Extension 35# 2 x 10 each   PATIENT EDUCATION:  Education details: HEP update and role of DN  Person educated: Patient Education method: Explanation, Demonstration, and Handouts Education comprehension: verbalized understanding and returned demonstration  HOME EXERCISE PROGRAM: Access Code: [email or web address] URL: [email or web address] Date: 02/27/2023 Prepared by: Claude Manges  Exercises - Static Prone on Elbows  - 1 x daily - 7 x weekly - 1 sets - 1 reps - 5-10 min hold - Prone Press Up  - 3-4 x daily - 7 x weekly - 1-3 sets - 10 reps - Standing Lumbar Extension at Wall - Forearms  - 3-4 x daily - 7 x weekly - 1-3 sets - 10 reps - Standing Lumbar Extension  - 3-4 x daily - 7 x weekly - 1-3 sets - 10 reps - Standard Plank  - 1 x daily - 7 x weekly - 1 sets - 5 reps - max hold -  Prone Hip Extension  - 1 x daily - 7 x weekly - 3 sets - 10 reps - Seated Piriformis Stretch with Trunk Bend  - 2 x daily - 7 x weekly - 1 sets - 3 reps - 30-60 sec  hold - Bird Dog  - 1 x daily - 7 x weekly - 3 sets - 10 reps - 5 sec hold - Supine Sciatic Nerve Glide  - 1 x daily - 7 x weekly - 1 sets - 10 reps - 5 sec hold - Supine 90/90 Alternating Heel Touches with Posterior Pelvic Tilt  - 1 x daily - 7 x weekly - 1 sets - 10 reps - Supine Figure 4 Piriformis Stretch  - 1 x daily - 7 x weekly - 3 sets - 30 hold - Pigeon Pose  - 1 x daily - 7 x weekly - 3 sets - 30 hold  ASSESSMENT:  CLINICAL IMPRESSION: Since last treatment session Sidhant's leg pain has started to travel down his calf and he feels muscle tightness in his calf. Incorporated calf and piriformis stretching and patient verbalized a decrease in pain. Patient required verbal and tactile cues for correct exercise performance. Updated patient's HEP to include supine figure four and standing pigeon pose stretch. Patient tolerated treatment session well and did not verbalize any increased leg pain. Provided patient with a handout about the benefits of dry needling. Patient will benefit from skilled PT to address the below impairments and improve overall function.     OBJECTIVE IMPAIRMENTS: decreased activity tolerance, decreased strength, hypomobility, increased muscle spasms, impaired flexibility, postural dysfunction, and pain.   ACTIVITY LIMITATIONS: bending, sitting, and squatting  PARTICIPATION LIMITATIONS: driving and occupation  PERSONAL FACTORS:  N/A  are also affecting patient's functional outcome.   REHAB POTENTIAL: Excellent  CLINICAL DECISION MAKING: Stable/uncomplicated  EVALUATION COMPLEXITY: Low   GOALS: Goals reviewed with patient? Yes  SHORT TERM GOALS: Target date: 02/15/2023   Patient will be independent with initial HEP.  Baseline:  Goal status: MET 02/20/2023  2.  Patient will report  centralization of radicular symptoms.  Baseline:  Goal status: IN PROGRESS 02/20/2023   LONG TERM GOALS: Target date: 03/15/2023   Patient will be independent with advanced/ongoing HEP to improve outcomes and carryover.  Baseline:  Goal status: INITIAL  2.  Patient will report 95% improvement in R leg and low back pain with sitting, sit to stand and bending to improve QOL.  Baseline:  Goal status: INITIAL  3.  Patient will report 80/80 on LEFS to demonstrate improved functional ability.  Baseline: 75 / 80 = 93.8  Goal status: INITIAL   4.  Patient to demonstrate ability to achieve and maintain good spinal alignment/posturing and body mechanics needed for daily activities. Baseline:  Goal status: INITIAL    PLAN:  PT FREQUENCY: 1x/week  PT DURATION: 6 weeks  PLANNED INTERVENTIONS: 97146- PT Re-evaluation, 97110-Therapeutic exercises, 97530- Therapeutic activity, [id number]- Neuromuscular re-education, 97535- Self Care, 40981- Manual therapy, 97014- Electrical stimulation (unattended), 19147- Traction (mechanical), Patient/Family education, Dry Needling, Spinal mobilization, Cryotherapy, and Moist heat.  PLAN FOR NEXT SESSION: assess pain levels; continue core strengthening as tolerated; see if patient is interested in DN   Claude Manges, PT 02/27/23 8:44 AM

## 2023-03-06 ENCOUNTER — Encounter: Payer: Self-pay | Admitting: Physical Therapy

## 2023-03-06 ENCOUNTER — Ambulatory Visit: Payer: 59 | Admitting: Physical Therapy

## 2023-03-06 DIAGNOSIS — M5459 Other low back pain: Secondary | ICD-10-CM

## 2023-03-06 DIAGNOSIS — M79604 Pain in right leg: Secondary | ICD-10-CM

## 2023-03-06 DIAGNOSIS — R252 Cramp and spasm: Secondary | ICD-10-CM | POA: Diagnosis not present

## 2023-03-06 DIAGNOSIS — M6281 Muscle weakness (generalized): Secondary | ICD-10-CM

## 2023-03-06 NOTE — Therapy (Addendum)
OUTPATIENT PHYSICAL THERAPY THORACOLUMBAR TREATMENT /RE- CERTIFICATION   Patient Name: Francisco Herring MRN: [id number] DOB:03-07-1991, 32 y.o., male Today's Date: 03/06/2023  END OF SESSION:  PT End of Session - 03/06/23 1319     Visit Number 6    Date for PT Re-Evaluation 04/19/23    Authorization Type [facility] [employer] focus    Progress Note Due on Visit 10    PT Start Time 1016    PT Stop Time 1055    PT Time Calculation (min) 39 min    Activity Tolerance Patient tolerated treatment well    Behavior During Therapy WFL for tasks assessed/performed                  Past Medical History:  Diagnosis Date   ADHD    Past Surgical History:  Procedure Laterality Date   TYMPANOPLASTY Bilateral    There are no problems to display for this patient.   PCP: Berniece Salines, FNP   REFERRING PROVIDER: Mecum, Oswaldo Conroy, PA-C   REFERRING DIAG:  [id number] (ICD-10-CM) - Right leg pain  S76.301A (ICD-10-CM) - Right hamstring injury, initial encounter  M54.50 (ICD-10-CM) - Acute right-sided low back pain, unspecified whether sciatica present    Rationale for Evaluation and Treatment: Rehabilitation  THERAPY DIAG:  Other low back pain  Pain in right leg  Muscle weakness (generalized)  Cramp and spasm  ONSET DATE: 12/08/22  SUBJECTIVE:                                                                                                                                                                                           SUBJECTIVE STATEMENT: Patient reports that his leg pain is no better or no worse. The piriformis stretching really helps in the morning. Currently his pain is 2/10. He got a back brace that goes down his leg and that has really helped wearing it at work. He does not experience any leg pain.  Eval: Picking up his 20# dog and his back popped. Couldn't move for a day and then his leg started hurting. No back pain now. No problem when he's moving around. Sitting for  30 min makes it worse.   PERTINENT HISTORY:    PAIN: 03/06/2023 Are you having pain? Yes: NPRS scale: 2/10 Pain location: R hip to just proximal to knee posterior Pain description: sharp then goes away Aggravating factors: sitting for 30 min, sit to stand Relieving factors: moving around   PRECAUTIONS: Other: sulfa allergy  RED FLAGS: None   WEIGHT BEARING RESTRICTIONS: No  FALLS:  Has patient fallen in last 6 months? No  LIVING ENVIRONMENT: Lives  with: lives with their spouse Lives in: House/apartment Stairs: No Has following equipment at home: None  OCCUPATION: [employer] supervisor  PLOF: Independent  PATIENT GOALS: not have the pain  NEXT MD VISIT: none scheduled  OBJECTIVE:  Note: Objective measures were completed at Evaluation unless otherwise noted.  DIAGNOSTIC FINDINGS:  none  PATIENT SURVEYS:  LEFS 75 / 80 = 93.8 %  COGNITION: Overall cognitive status: Within functional limits for tasks assessed     SENSATION: WFL  MUSCLE LENGTH: Mild HS  POSTURE: rounded shoulders and forward head  PALPATION: Pain in the leg with UPA R L5/S1 and L4/5, tight R lumbar  LUMBAR ROM: Full, feels in leg with R SB, ext, flex  LOWER EXTREMITY ROM:   WNL  LOWER EXTREMITY MMT:  grossly 5/5 some weakness in R lumbar with L hip flex testing  LUMBAR SPECIAL TESTS:  Straight leg raise test: Negative, Slump test: Positive, and Quadrant test: Positive   TODAY'S TREATMENT:                                                                                                                              DATE:   03/06/23 Recumbent bike 10 minutes Level 3 - PT present to discuss status Supine figure four with hip flexion 2 x 30 sec Rt Single knee to chest stretch 2 x 30 sec bilateral  Supine 90/90 heel taps x 10 B  Dead Bugs x 20 - patient felt popping in his back  Bird dog 2 x 10 Quadruped lifts 3 x 20 sec- challenging for patient Pallof Press at cable column sitting on SB 10# x  20 each way Chops at cable column 10# x 20 each way Standing march with 2 sec hold & unilateral 15lb KB x 10 each Seated trunk extension holding 15lb KB 2 x 10 Standing KB passes 30# KB (clockwise and counter clock wise) x 20 each direction Unilateral Farmer's Carry 30lb x 4    02/27/23 Recumbent bike 6 minutes Level 2 - PT present to discuss status Calf stretch on rocker board 3 x 30 Supine figure four with hip flexion 3 x 30 sec Single knee to chest stretch 2 x 30 sec Supine 90/90 heel taps x 10 B  Supine 90/90 Knee extension x 10 bilateral Bird dog 2 x 10 Standing pigeon over plinth 2 x 30 sec Pallof Press at cable column 10# x 20 each way Chops at cable column 10# x 20 each way Standing march with 2 sec hold & unilateral 10lb KB x 10 each Unilateral Farmer's Carry 30lb x 4    02/20/23 Recumbent bike 6 minutes- PT present to discuss status R UPA mobs to L5/S1 -patient had pain with Grade IV that went down leg ; he tolerated Grade III Prone press ups x 10 Prone hip ext over one pillow x 10 B Bird dog 2 x 10 Supine 90/90 heel taps x 10 B  Supine heel slides  x 10 each Standing T on R LE 2 x 10 focusing on level pelvis and chest out Resisted turning cable column  x 10  Pallof Press at cable column 10# x 20 each way Chops at cable column 10# x 20 each way Standing march with 2 sec hold & unilateral 10lb KB x 10 each Seated hinging x 10  Standing hinging against wall x 10    PATIENT EDUCATION:  Education details: HEP update and role of DN  Person educated: Patient Education method: Explanation, Demonstration, and Handouts Education comprehension: verbalized understanding and returned demonstration  HOME EXERCISE PROGRAM: Access Code: [email or web address] URL: [email or web address] Date: 02/27/2023 Prepared by: Claude Manges  Exercises - Static Prone on Elbows  - 1 x daily - 7 x weekly - 1 sets - 1 reps - 5-10 min hold - Prone Press Up  - 3-4 x daily - 7 x weekly -  1-3 sets - 10 reps - Standing Lumbar Extension at Wall - Forearms  - 3-4 x daily - 7 x weekly - 1-3 sets - 10 reps - Standing Lumbar Extension  - 3-4 x daily - 7 x weekly - 1-3 sets - 10 reps - Standard Plank  - 1 x daily - 7 x weekly - 1 sets - 5 reps - max hold - Prone Hip Extension  - 1 x daily - 7 x weekly - 3 sets - 10 reps - Seated Piriformis Stretch with Trunk Bend  - 2 x daily - 7 x weekly - 1 sets - 3 reps - 30-60 sec  hold - Bird Dog  - 1 x daily - 7 x weekly - 3 sets - 10 reps - 5 sec hold - Supine Sciatic Nerve Glide  - 1 x daily - 7 x weekly - 1 sets - 10 reps - 5 sec hold - Supine 90/90 Alternating Heel Touches with Posterior Pelvic Tilt  - 1 x daily - 7 x weekly - 1 sets - 10 reps - Supine Figure 4 Piriformis Stretch  - 1 x daily - 7 x weekly - 3 sets - 30 hold - Pigeon Pose  - 1 x daily - 7 x weekly - 3 sets - 30 hold  ASSESSMENT:  CLINICAL IMPRESSION: Re evaluation is being completed today since Eufemio does not have an appointment next week due to having a follow up scheduled with his PCP. Zyaire continues to experience increased Rt leg pain that has not improved. He verbalized relief in the mornings after doing piriformis stretches. Educated patient to schedule a follow up appointment with his PCP to discuss his continued Rt leg symptoms. Progressed patient's core exercises today and the quadruped lift offs and pallof presses seated on a stability ball were challenging for patient. He required verbal and visual cues for correct exercise performance. Patient will continue to benefit from therapy to address pain and meet established goals.Patient will benefit from skilled PT to address the below impairments and improve overall function.     OBJECTIVE IMPAIRMENTS: decreased activity tolerance, decreased strength, hypomobility, increased muscle spasms, impaired flexibility, postural dysfunction, and pain.   ACTIVITY LIMITATIONS: bending, sitting, and squatting  PARTICIPATION  LIMITATIONS: driving and occupation  PERSONAL FACTORS:  N/A  are also affecting patient's functional outcome.   REHAB POTENTIAL: Excellent  CLINICAL DECISION MAKING: Stable/uncomplicated  EVALUATION COMPLEXITY: Low   GOALS: Goals reviewed with patient? Yes  SHORT TERM GOALS: Target date: 02/15/2023   Patient will be independent with initial HEP.  Baseline:  Goal  status: MET 02/20/2023  2.  Patient will report centralization of radicular symptoms.  Baseline:  Goal status: IN PROGRESS 03/06/2023   LONG TERM GOALS: Target date: 04/19/2023   Patient will be independent with advanced/ongoing HEP to improve outcomes and carryover.  Baseline:  Goal status: INITIAL 03/06/2023  2.  Patient will report 95% improvement in R leg and low back pain with sitting, sit to stand and bending to improve QOL.  Baseline:  Goal status: IN PROGRESS 03/06/2023  3.  Patient will report 80/80 on LEFS to demonstrate improved functional ability.  Baseline: 75 / 80 = 93.8  Goal status: IN PROGRESS 03/06/2023  4.  Patient to demonstrate ability to achieve and maintain good spinal alignment/posturing and body mechanics needed for daily activities. Baseline:  Goal status: IN PROGRESS 03/06/2023    PLAN:  PT FREQUENCY: 1x/week  PT DURATION: 6 weeks  PLANNED INTERVENTIONS: 97146- PT Re-evaluation, 97110-Therapeutic exercises, 97530- Therapeutic activity, 97112- Neuromuscular re-education, 97535- Self Care, 62952- Manual therapy, 97014- Electrical stimulation (unattended), 84132- Traction (mechanical), Patient/Family education, Dry Needling, Spinal mobilization, Cryotherapy, and Moist heat.  PLAN FOR NEXT SESSION: assess pain levels; see how MD visit went; imaging? ; continue core exercises; traction; dry needling if patient is interested; Addaday to glutes   Claude Manges, PT 03/06/23 1:20 PM   PHYSICAL THERAPY DISCHARGE SUMMARY  Visits from Start of Care: 6  As of 05/23/2023 Patient has  not returned to therapy. Patient to be discharged at this time.   Patient agrees to discharge. Patient goals were not met. Patient is being discharged due to not returning since the last visit.  Claude Manges, PT 05/23/23 8:30 AM

## 2023-03-12 NOTE — Progress Notes (Unsigned)
There were no vitals taken for this visit.   Subjective:    Patient ID: Francisco Herring, male    DOB: 05-05-1990, 32 y.o.   MRN: [id number]  HPI: Francisco Herring is a 32 y.o. male  No chief complaint on file.   Relevant past medical, surgical, family and social history reviewed and updated as indicated. Interim medical history since our last visit reviewed. Allergies and medications reviewed and updated.  Review of Systems  Constitutional: Negative for fever or weight change.  Respiratory: Negative for cough and shortness of breath.   Cardiovascular: Negative for chest pain or palpitations.  Gastrointestinal: Negative for abdominal pain, no bowel changes.  Musculoskeletal: Negative for gait problem or joint swelling.  Skin: Negative for rash.  Neurological: Negative for dizziness or headache.  No other specific complaints in a complete review of systems (except as listed in HPI above).      Objective:    There were no vitals taken for this visit.  Wt Readings from Last 3 Encounters:  02/19/23 194 lb (88 kg)  01/22/23 192 lb 3.2 oz (87.2 kg)  12/12/22 198 lb 6.4 oz (90 kg)    Physical Exam  Constitutional: Patient appears well-developed and well-nourished. Obese *** No distress.  HEENT: head atraumatic, normocephalic, pupils equal and reactive to light, ears ***, neck supple, throat within normal limits Cardiovascular: Normal rate, regular rhythm and normal heart sounds.  No murmur heard. No BLE edema. Pulmonary/Chest: Effort normal and breath sounds normal. No respiratory distress. Abdominal: Soft.  There is no tenderness. Psychiatric: Patient has a normal mood and affect. behavior is normal. Judgment and thought content normal.  Results for orders placed or performed in visit on 12/12/22  CBC with Differential/Platelet  Result Value Ref Range   WBC 5.9 3.8 - 10.8 Thousand/uL   RBC 5.73 4.20 - 5.80 Million/uL   Hemoglobin 16.7 13.2 - 17.1 g/dL   HCT 28.4 13.2 -  44.0 %   MCV 84.8 80.0 - 100.0 fL   MCH 29.1 27.0 - 33.0 pg   MCHC 34.4 32.0 - 36.0 g/dL   RDW 10.2 72.5 - 36.6 %   Platelets 327 140 - 400 Thousand/uL   MPV 9.2 7.5 - 12.5 fL   Neutro Abs 3,251 1,500 - 7,800 cells/uL   Lymphs Abs 1,959 850 - 3,900 cells/uL   Absolute Monocytes 537 200 - 950 cells/uL   Eosinophils Absolute 112 15 - 500 cells/uL   Basophils Absolute 41 0 - 200 cells/uL   Neutrophils Relative % 55.1 %   Total Lymphocyte 33.2 %   Monocytes Relative 9.1 %   Eosinophils Relative 1.9 %   Basophils Relative 0.7 %  COMPLETE METABOLIC PANEL WITH GFR  Result Value Ref Range   Glucose, Bld 93 65 - 99 mg/dL   BUN 12 7 - 25 mg/dL   Creat 4.40 3.47 - 4.25 mg/dL   eGFR 87 > OR = 60 ZD/GLO/7.56E3   BUN/Creatinine Ratio SEE NOTE: 6 - 22 (calc)   Sodium 140 135 - 146 mmol/L   Potassium 4.6 3.5 - 5.3 mmol/L   Chloride 103 98 - 110 mmol/L   CO2 27 20 - 32 mmol/L   Calcium 10.5 (H) 8.6 - 10.3 mg/dL   Total Protein 7.4 6.1 - 8.1 g/dL   Albumin 4.9 3.6 - 5.1 g/dL   Globulin 2.5 1.9 - 3.7 g/dL (calc)   AG Ratio 2.0 1.0 - 2.5 (calc)   Total Bilirubin 0.5 0.2 - 1.2 mg/dL  Alkaline phosphatase (APISO) 63 36 - 130 U/L   AST 23 10 - 40 U/L   ALT 43 9 - 46 U/L  Lipid panel  Result Value Ref Range   Cholesterol 244 (H) <200 mg/dL   HDL 47 > OR = 40 mg/dL   Triglycerides 981 (H) <150 mg/dL   LDL Cholesterol (Calc) 167 (H) mg/dL (calc)   Total CHOL/HDL Ratio 5.2 (H) <5.0 (calc)   Non-HDL Cholesterol (Calc) 197 (H) <130 mg/dL (calc)  Hemoglobin X9J  Result Value Ref Range   Hgb A1c MFr Bld 5.5 <5.7 % of total Hgb   Mean Plasma Glucose 111 mg/dL   eAG (mmol/L) 6.2 mmol/L  Lipoprotein A (LPA)  Result Value Ref Range   Lipoprotein (a) <10 <75 nmol/L      Assessment & Plan:   Problem List Items Addressed This Visit   None    Follow up plan: No follow-ups on file.

## 2023-03-13 ENCOUNTER — Ambulatory Visit (INDEPENDENT_AMBULATORY_CARE_PROVIDER_SITE_OTHER): Payer: 59 | Admitting: Nurse Practitioner

## 2023-03-13 ENCOUNTER — Encounter: Payer: Self-pay | Admitting: Nurse Practitioner

## 2023-03-13 ENCOUNTER — Other Ambulatory Visit: Payer: Self-pay

## 2023-03-13 ENCOUNTER — Encounter: Payer: 59 | Admitting: Physical Therapy

## 2023-03-13 ENCOUNTER — Other Ambulatory Visit (HOSPITAL_COMMUNITY): Payer: Self-pay

## 2023-03-13 VITALS — BP 120/80 | HR 87 | Temp 97.8°F | Resp 18 | Ht 69.0 in | Wt 197.4 lb

## 2023-03-13 DIAGNOSIS — G8929 Other chronic pain: Secondary | ICD-10-CM | POA: Diagnosis not present

## 2023-03-13 DIAGNOSIS — M5441 Lumbago with sciatica, right side: Secondary | ICD-10-CM | POA: Diagnosis not present

## 2023-03-13 DIAGNOSIS — E782 Mixed hyperlipidemia: Secondary | ICD-10-CM | POA: Diagnosis not present

## 2023-03-13 MED ORDER — METHOCARBAMOL 500 MG PO TABS
500.0000 mg | ORAL_TABLET | Freq: Two times a day (BID) | ORAL | 0 refills | Status: DC | PRN
Start: 2023-03-13 — End: 2023-12-12
  Filled 2023-03-13: qty 20, 10d supply, fill #0

## 2023-03-18 ENCOUNTER — Ambulatory Visit: Payer: 59 | Admitting: Physical Therapy

## 2023-03-28 ENCOUNTER — Other Ambulatory Visit: Payer: Self-pay | Admitting: Nurse Practitioner

## 2023-03-28 ENCOUNTER — Encounter: Payer: Self-pay | Admitting: Nurse Practitioner

## 2023-03-28 ENCOUNTER — Ambulatory Visit (HOSPITAL_COMMUNITY)
Admission: RE | Admit: 2023-03-28 | Discharge: 2023-03-28 | Disposition: A | Discharge status: Home / Self Care | Payer: 59 | Source: Ambulatory Visit | Attending: Nurse Practitioner | Admitting: Nurse Practitioner

## 2023-03-28 DIAGNOSIS — M48061 Spinal stenosis, lumbar region without neurogenic claudication: Secondary | ICD-10-CM | POA: Diagnosis not present

## 2023-03-28 DIAGNOSIS — M51369 Other intervertebral disc degeneration, lumbar region without mention of lumbar back pain or lower extremity pain: Secondary | ICD-10-CM

## 2023-03-28 DIAGNOSIS — G8929 Other chronic pain: Secondary | ICD-10-CM | POA: Diagnosis not present

## 2023-03-28 DIAGNOSIS — M5441 Lumbago with sciatica, right side: Secondary | ICD-10-CM | POA: Diagnosis not present

## 2023-03-28 DIAGNOSIS — M47816 Spondylosis without myelopathy or radiculopathy, lumbar region: Secondary | ICD-10-CM | POA: Diagnosis not present

## 2023-03-28 DIAGNOSIS — M5126 Other intervertebral disc displacement, lumbar region: Secondary | ICD-10-CM | POA: Diagnosis not present

## 2023-04-25 ENCOUNTER — Other Ambulatory Visit (INDEPENDENT_AMBULATORY_CARE_PROVIDER_SITE_OTHER): Payer: 59

## 2023-04-25 ENCOUNTER — Ambulatory Visit: Payer: 59 | Admitting: Orthopedic Surgery

## 2023-04-25 VITALS — BP 148/91 | HR 111 | Ht 69.0 in | Wt 194.0 lb

## 2023-04-25 DIAGNOSIS — M545 Low back pain, unspecified: Secondary | ICD-10-CM

## 2023-04-25 NOTE — Progress Notes (Signed)
 Orthopedic Spine Surgery Office Note  Assessment: Patient is a 33 y.o. male with right buttock and posterior thigh pain consistent with radiculopathy.  Has a right paracentral L5/S1 disc herniation   Plan: -Explained that initially conservative treatment is tried as a significant number of patients may experience relief with these treatment modalities. Discussed that the conservative treatments include:  -activity modification  -physical therapy  -over the counter pain medications  -medrol dosepak  -lumbar steroid injections -Patient has tried PT, Tylenol -Recommended continue with the Tylenol (he can use up to 1000 mg 3 times daily) and home exercise program -If he is not doing any better at her next visit, would recommend an injection -Patient should return to office in 4 weeks, x-rays at next visit: None   Patient expressed understanding of the plan and all questions were answered to the patient's satisfaction.   ___________________________________________________________________________   History:  Patient is a 33 y.o. male who presents today for lumbar spine.  Patient has had about 4 to 5 months of pain that radiates into his right lower extremity.  He said it initially started when he was moving.  He bent over to pick something up and felt a pulling and popping sensation.  Initially, the pain was severe and he felt it in his low back.  It quickly decreased in terms of intensity.  He then noticed low back pain that was radiating into his right lower extremity.  He felt it along the right buttock and the posterior aspect of the right thigh.  His back pain has now gotten better.  He is not having any back pain.  He still has some right leg pain but it has gotten significantly better over time.  He is not having any pain radiating into the left lower extremity.   Weakness: Denies Symptoms of imbalance: Denies Paresthesias and numbness: Denies Bowel or bladder incontinence:  Denies Saddle anesthesia: Denies  Treatments tried: PT, Tylenol  Review of systems: Denies fevers and chills, night sweats, unexplained weight loss, history of cancer, pain that wakes them at night  Past medical history: HLD  Allergies: Sulfa  Past surgical history:  Tympanoplasty  Social history: Denies use of nicotine product (smoking, vaping, patches, smokeless) Alcohol use: Yes, approximately 1 drink per week Denies recreational drug use   Physical Exam:  BMI of 28.7  General: no acute distress, appears stated age Neurologic: alert, answering questions appropriately, following commands Respiratory: unlabored breathing on room air, symmetric chest rise Psychiatric: appropriate affect, normal cadence to speech   MSK (spine):  -Strength exam      Left  Right EHL    5/5  5/5 TA    5/5  5/5 GSC    5/5  5/5 Knee extension  5/5  5/5 Hip flexion   5/5  5/5  -Sensory exam    Sensation intact to light touch in L3-S1 nerve distributions of bilateral lower extremities  -Achilles DTR: 2/4 on the left, 2/4 on the right -Patellar tendon DTR: 2/4 on the left, 2/4 on the right  -Straight leg raise: Negative bilaterally -Femoral nerve stretch test: Negative bilaterally -Clonus: no beats bilaterally  -Left hip exam: No pain through range of motion, negative Stinchfield, negative FABER -Right hip exam: No pain through range of motion, negative Stinchfield, negative FABER  Imaging: XRs of the lumbar spine from 04/25/2023 were independently reviewed and interpreted, showing no significant degenerative changes.  No fracture or dislocation seen.  No evidence of instability on flexion/extension views.  MRI of the lumbar spine from 03/28/2023 was independently reviewed and interpreted, showing large right paracentral disc herniation at L5/S1 causing lateral recess stenosis.  No other significant stenosis seen.  No significant DDD.   Patient name: Francisco Herring Patient MRN:  [id number] Date of visit: 04/25/23

## 2023-05-10 LAB — LIPID PANEL
Chol/HDL Ratio: 3.3 {ratio} (ref 0.0–5.0)
Cholesterol, Total: 157 mg/dL (ref 100–199)
HDL: 47 mg/dL (ref >39)
LDL Chol Calc (NIH): 92 mg/dL (ref 0–99)
Triglycerides: 100 mg/dL (ref 0–149)
VLDL Cholesterol Cal: 18 mg/dL (ref 5–40)

## 2023-05-10 LAB — HEPATIC FUNCTION PANEL
ALT: 45 [IU]/L — ABNORMAL HIGH (ref 0–44)
AST: 20 [IU]/L (ref 0–40)
Albumin: 4.7 g/dL (ref 4.1–5.1)
Alkaline Phosphatase: 84 [IU]/L (ref 44–121)
Bilirubin Total: 0.4 mg/dL (ref 0.0–1.2)
Bilirubin, Direct: 0.13 mg/dL (ref 0.00–0.40)
Total Protein: 7 g/dL (ref 6.0–8.5)

## 2023-05-10 LAB — LIPOPROTEIN A (LPA): Lipoprotein (a): <8.4 nmol/L (ref <75.0)

## 2023-05-14 ENCOUNTER — Ambulatory Visit: Payer: 59 | Attending: Internal Medicine | Admitting: Internal Medicine

## 2023-05-14 ENCOUNTER — Encounter: Payer: Self-pay | Admitting: Internal Medicine

## 2023-05-14 ENCOUNTER — Other Ambulatory Visit (HOSPITAL_COMMUNITY): Payer: Self-pay

## 2023-05-14 VITALS — BP 116/76 | HR 81 | Ht 69.0 in | Wt 201.4 lb

## 2023-05-14 DIAGNOSIS — Z79899 Other long term (current) drug therapy: Secondary | ICD-10-CM

## 2023-05-14 DIAGNOSIS — Z8249 Family history of ischemic heart disease and other diseases of the circulatory system: Secondary | ICD-10-CM | POA: Diagnosis not present

## 2023-05-14 DIAGNOSIS — E782 Mixed hyperlipidemia: Secondary | ICD-10-CM | POA: Diagnosis not present

## 2023-05-14 MED ORDER — ATORVASTATIN CALCIUM 20 MG PO TABS
20.0000 mg | ORAL_TABLET | Freq: Every day | ORAL | 3 refills | Status: AC
  Filled 2023-05-14: qty 90, 90d supply, fill #0
  Filled 2023-09-02: qty 90, 90d supply, fill #1
  Filled 2023-12-24: qty 90, 90d supply, fill #2
  Filled 2024-03-26: qty 90, 90d supply, fill #3

## 2023-05-14 NOTE — Progress Notes (Signed)
  Cardiology Office Note:  .   Date:  05/14/2023  ID:  Helen Hashimoto, DOB 1990-12-16, MRN [id number] PCP: Berniece Salines, FNP  [facility] Providers Cardiologist:  Parke Poisson, MD    History of Present Illness: Francisco Herring   Konstantin Heiges is a 33 y.o. male.  Discussed the use of AI scribe software for clinical note transcription with the patient, who gave verbal consent to proceed.  History of Present Illness   The patient, with a family history of intolerance to statins, presents for follow-up of cholesterol management. He reports no adverse effects from atorvastatin 10mg  daily. His LDL has improved to 92, and lipoprotein A is less than 8. He has not made any significant dietary changes in the past three months due to a hectic schedule. He expresses a willingness to increase the dose of atorvastatin to further lower his LDL.        ROS: negative except per HPI above.  Studies Reviewed: .        Results   LABS Lipoprotein A: <8 LDL: 92 Triglycerides: 100 ALT: 45  DIAGNOSTIC Calcium score: 0     Risk Assessment/Calculations:             Physical Exam:   VS:  BP 116/76 (BP Location: Left Arm, Patient Position: Sitting, Cuff Size: Large)   Pulse 81   Ht 5\' 9"  (1.753 m)   Wt 201 lb 6.4 oz (91.4 kg)   SpO2 96%   BMI 29.74 kg/m    Wt Readings from Last 3 Encounters:  05/14/23 201 lb 6.4 oz (91.4 kg)  04/25/23 194 lb (88 kg)  03/13/23 197 lb 6.4 oz (89.5 kg)     Physical Exam         GEN: Well nourished, well developed in no acute distress NECK: No JVD; No carotid bruits CARDIAC: RRR, no murmurs, rubs, gallops RESPIRATORY:  Clear to auscultation without rales, wheezing or rhonchi  ABDOMEN: Soft, non-tender, non-distended EXTREMITIES:  No edema; No deformity   ASSESSMENT AND PLAN: .    Assessment & Plan Mixed hyperlipidemia  Family history of premature CAD  Medication management   Assessment and Plan    Hyperlipidemia LDL of 92, despite  improvement from previous levels. Discussed the option of increasing Atorvastatin dose to further lower LDL, considering family history. Patient tolerating current dose without side effects. -Increase Atorvastatin from 10mg  to 20mg  daily. -Repeat lipid panel in 6 months.  Liver function Slight elevation in ALT (45, upper limit of normal 44). No significant alcohol consumption reported. -Check liver function tests in 6 weeks to monitor ALT levels.  Diet and Lifestyle Discussed the impact of diet on cholesterol levels. Patient acknowledges room for improvement in dietary habits. -Encourage healthier food choices, particularly when eating out. -Consider higher protein intake to promote satiety and reduce unhealthy food choices.  Follow-up in 6 months.

## 2023-05-14 NOTE — Patient Instructions (Addendum)
Medication Instructions:  Increase atorvastatin (LIPITOR) 20mg  daily    *If you need a refill on your cardiac medications before your next appointment, please call your pharmacy*   Lab Work: LIPID PANEL in 6 months  Liver Function Test in 6 weeks    If you have labs (blood work) drawn today and your tests are completely normal, you will receive your results only by: MyChart Message (if you have MyChart) OR A paper copy in the mail If you have any lab test that is abnormal or we need to change your treatment, we will call you to review the results.   Testing/Procedures: None    Follow-Up: At [facility], you and your health needs are our priority.  As part of our continuing mission to provide you with exceptional heart care, we have created designated Provider Care Teams.  These Care Teams include your primary Cardiologist (physician) and Advanced Practice Providers (APPs -  Physician Assistants and Nurse Practitioners) who all work together to provide you with the care you need, when you need it.  We recommend signing up for the patient portal called "MyChart".  Sign up information is provided on this After Visit Summary.  MyChart is used to connect with patients for Virtual Visits (Telemedicine).  Patients are able to view lab/test results, encounter notes, upcoming appointments, etc.  Non-urgent messages can be sent to your provider as well.   To learn more about what you can do with MyChart, go to [email or web address].    Your next appointment:   6 month(s)  The format for your next appointment:   In Person  Provider:   Parke Poisson, MD    Other Instructions

## 2023-09-23 ENCOUNTER — Encounter: Payer: Self-pay | Admitting: Internal Medicine

## 2023-11-15 DIAGNOSIS — E782 Mixed hyperlipidemia: Secondary | ICD-10-CM | POA: Diagnosis not present

## 2023-11-15 DIAGNOSIS — Z8249 Family history of ischemic heart disease and other diseases of the circulatory system: Secondary | ICD-10-CM | POA: Diagnosis not present

## 2023-11-16 ENCOUNTER — Ambulatory Visit: Payer: Self-pay | Admitting: Internal Medicine

## 2023-11-16 LAB — HEPATIC FUNCTION PANEL
ALT: 43 IU/L (ref 0–44)
AST: 23 IU/L (ref 0–40)
Albumin: 4.6 g/dL (ref 4.1–5.1)
Alkaline Phosphatase: 77 IU/L (ref 44–121)
Bilirubin Total: 0.5 mg/dL (ref 0.0–1.2)
Bilirubin, Direct: 0.16 mg/dL (ref 0.00–0.40)
Total Protein: 6.9 g/dL (ref 6.0–8.5)

## 2023-11-16 LAB — LIPID PANEL
Chol/HDL Ratio: 3.4 ratio (ref 0.0–5.0)
Cholesterol, Total: 136 mg/dL (ref 100–199)
HDL: 40 mg/dL (ref >39)
LDL Chol Calc (NIH): 79 mg/dL (ref 0–99)
Triglycerides: 87 mg/dL (ref 0–149)
VLDL Cholesterol Cal: 17 mg/dL (ref 5–40)

## 2023-11-25 ENCOUNTER — Encounter: Payer: Self-pay | Admitting: Internal Medicine

## 2023-11-26 ENCOUNTER — Encounter: Payer: Self-pay | Admitting: Nurse Practitioner

## 2023-12-12 ENCOUNTER — Ambulatory Visit: Attending: Internal Medicine | Admitting: Internal Medicine

## 2023-12-12 VITALS — BP 122/74 | HR 94 | Resp 16 | Ht 69.0 in | Wt 202.4 lb

## 2023-12-12 DIAGNOSIS — Z79899 Other long term (current) drug therapy: Secondary | ICD-10-CM

## 2023-12-12 DIAGNOSIS — E782 Mixed hyperlipidemia: Secondary | ICD-10-CM | POA: Diagnosis not present

## 2023-12-12 DIAGNOSIS — Z8249 Family history of ischemic heart disease and other diseases of the circulatory system: Secondary | ICD-10-CM

## 2023-12-12 NOTE — Progress Notes (Signed)
 Cardiology Office Note:  .   Date:  12/12/2023  ID:  Francisco Herring, DOB 1990/06/23, MRN [id number] PCP: Gareth Mliss FALCON, FNP  [facility] Providers Cardiologist:  Soyla DELENA Merck, MD    History of Present Illness: Francisco   Mohan Herring is a 33 y.o. male.  Discussed the use of AI scribe software for clinical note transcription with the patient, who gave verbal consent to proceed.  History of Present Illness Francisco Herring is a 33 year old male who presents for a cardiovascular evaluation.  He is currently on atorvastatin , which was increased from 10 mg to 20 mg, resulting in a decrease in LDL cholesterol to 92 mg/dL. His lipoprotein A is low at 8. His calcium  score is zero, and liver function tests are within normal limits, with ALT at 43. HDL cholesterol is at 40.  No current chest pain or shortness of breath. He notes that work can be stressful. He is concerned about his cardiovascular health due to a family history of cardiovascular issues, with his mother having had a stent placed at 74 and his grandfather undergoing CABG at 62.  He recalls that one of his parents, likely his father, started blood pressure medication in his forties or fifties.  He is trying to improve his diet and mentions a change in eating habits. He is also attempting to incorporate more physical activity into his routine, such as taking the stairs and we discussed using a treadmill at home while studying for his Master's program.     ROS: negative except per HPI above.  Studies Reviewed: .        Results LABS LDL: 92 (10/2023) Lipoprotein A: 8 (10/2023) ALT: 43 (10/2023) HDL: 40 (10/2023)  RADIOLOGY Calcium  score: 0 Risk Assessment/Calculations:       Physical Exam:   VS:  BP 122/74 (BP Location: Right Arm, Patient Position: Sitting, Cuff Size: Normal)   Pulse 94   Resp 16   Ht 5' 9 (1.753 m)   Wt 202 lb 6.4 oz (91.8 kg)   SpO2 96%   BMI 29.89 kg/m    Wt Readings from Last 3  Encounters:  12/12/23 202 lb 6.4 oz (91.8 kg)  05/14/23 201 lb 6.4 oz (91.4 kg)  04/25/23 194 lb (88 kg)     Physical Exam GENERAL: Alert, cooperative, well developed, no acute distress, normal appearance HEENT: Normocephalic, normal oropharynx, moist mucous membranes CHEST: Clear to auscultation bilaterally, no wheezes, rhonchi, or crackles CARDIOVASCULAR: Normal heart rate and rhythm, S1 and S2 normal without murmurs ABDOMEN: Soft, non-tender, non-distended, without organomegaly, normal bowel sounds EXTREMITIES: No cyanosis or edema NEUROLOGICAL: Cranial nerves grossly intact, moves all extremities without gross motor or sensory deficit   ASSESSMENT AND PLAN: .    Assessment and Plan Assessment & Plan Hyperlipidemia with family history of premature coronary artery disease LDL cholesterol is 51, which is insufficiently low given the family history of premature coronary artery disease (mother's stent at 54, grandfather's CABG at 43). Calcium  score is zero, but may not be adequate for young individuals with risk factors. HDL cholesterol is 40, which is adequate. Goal is to lower LDL to around 55. Discussed diet and exercise for cholesterol management and potential future coronary CT to assess plaque burden. - Continue atorvastatin  20 mg daily. - Encourage dietary modifications to lower LDL cholesterol. - Encourage aerobic exercise to increase HDL cholesterol. - Consider coronary CT in the future to assess plaque burden, especially if LDL goals are not met  or if symptoms develop.  Stable mildly elevated liver enzymes ALT levels previously one point above the upper limit of normal are now one point below, indicating stable liver function. No significant changes over the past year. - Recheck liver function tests at the next cholesterol check.  Elevated blood pressure reading,  Blood pressure was elevated during the visit, but previous readings have been normal. Current elevated reading  likely inaccurate due to recent physical activity. - Recheck blood pressure before leaving the clinic, normalized to 122/74 at end of visit.  - Monitor blood pressure regularly, especially if symptoms or concerns arise.      Soyla Merck, MD, FACC

## 2023-12-12 NOTE — Patient Instructions (Signed)
 Medication Instructions:  No Changes *If you need a refill on your cardiac medications before your next appointment, please call your pharmacy*  Lab Work: In about 6 months, prior to your next follow up visit with Dr. Loni, FASTING Lipid Panel and Liver Panel (*Due Feb 21/2026)  If you have labs (blood work) drawn today and your tests are completely normal, you will receive your results only by: MyChart Message (if you have MyChart) OR A paper copy in the mail If you have any lab test that is abnormal or we need to change your treatment, we will call you to review the results.   Follow-Up: At [facility], you and your health needs are our priority.  As part of our continuing mission to provide you with exceptional heart care, our providers are all part of one team.  This team includes your primary Cardiologist (physician) and Advanced Practice Providers or APPs (Physician Assistants and Nurse Practitioners) who all work together to provide you with the care you need, when you need it.  Your next appointment:   6 month(s)  Provider:   Gayatri A Acharya, MD   Other Instructions Please call us  or send a MyChart message with any Cardiology related questions/concerns.  [phone].  Thank you!

## 2023-12-18 ENCOUNTER — Encounter: Payer: Self-pay | Admitting: Nurse Practitioner

## 2023-12-18 ENCOUNTER — Other Ambulatory Visit: Payer: Self-pay

## 2023-12-18 ENCOUNTER — Ambulatory Visit (INDEPENDENT_AMBULATORY_CARE_PROVIDER_SITE_OTHER): Payer: Self-pay | Admitting: Nurse Practitioner

## 2023-12-18 VITALS — BP 118/72 | HR 87 | Temp 98.0°F | Resp 16 | Ht 69.0 in | Wt 201.5 lb

## 2023-12-18 DIAGNOSIS — Z0000 Encounter for general adult medical examination without abnormal findings: Secondary | ICD-10-CM

## 2023-12-18 DIAGNOSIS — E782 Mixed hyperlipidemia: Secondary | ICD-10-CM

## 2023-12-18 DIAGNOSIS — Z131 Encounter for screening for diabetes mellitus: Secondary | ICD-10-CM

## 2023-12-18 DIAGNOSIS — Z130 Encounter for screening for diseases of the blood and blood-forming organs and certain disorders involving the immune mechanism: Secondary | ICD-10-CM | POA: Diagnosis not present

## 2023-12-18 NOTE — Progress Notes (Signed)
 Name: Francisco Herring   MRN: [id number]    DOB: 02-Nov-1990   Date:12/18/2023       Progress Note  Subjective  Chief Complaint  Chief Complaint  Patient presents with   Annual Exam    HPI  Patient presents for annual CPE. Discussed the use of AI scribe software for clinical note transcription with the patient, who gave verbal consent to proceed.  History of Present Illness Francisco Herring is a 33 year old male who presents for an annual physical exam and medical clearance for adoption.  Cardiovascular risk assessment - Hyperlipidemia managed with atorvastatin  20 mg daily - Last lipid panel on November 15, 2023, within normal range - Monitored by cardiology - CT scan for plaque screening performed due to family history of cardiovascular disease  Metabolic health - Last hemoglobin A1c was 5.5, performed last year - Weight is 201 pounds - Height is 5'9 - BMI is 29.76  Lifestyle and preventive health - Maintains a well-balanced diet - Exercises three days per week - Sleeps approximately seven hours per night  Medical clearance for adoption - Undergoing adoption process requiring medical clearance - Process includes fingerprinting for the [employer], home visits, education classes, and pediatric CPR training      Diet: Regular, well balanced Exercise: 3 days 45 minutes Sleep: 7 hours Last dental exam:Completed Last eye exam: will schedule  Depression: phq 9 is negative    12/18/2023    7:57 AM 03/13/2023    8:49 AM 01/22/2023   10:29 AM 12/12/2022    8:15 AM 11/13/2021   10:29 AM  Depression screen PHQ 2/9  Decreased Interest 0 0 0 0 0  Down, Depressed, Hopeless 0 0 0 0 0  PHQ - 2 Score 0 0 0 0 0  Altered sleeping   0    Tired, decreased energy   0    Change in appetite   0    Feeling bad or failure about yourself    0    Trouble concentrating   0    Moving slowly or fidgety/restless   0    Suicidal thoughts   0    PHQ-9 Score   0    Difficult doing work/chores   Not  difficult at all      Hypertension:  BP Readings from Last 3 Encounters:  12/18/23 118/72  12/12/23 122/74  05/14/23 116/76    Obesity: Wt Readings from Last 3 Encounters:  12/18/23 201 lb 8 oz (91.4 kg)  12/12/23 202 lb 6.4 oz (91.8 kg)  05/14/23 201 lb 6.4 oz (91.4 kg)   BMI Readings from Last 3 Encounters:  12/18/23 29.76 kg/m  12/12/23 29.89 kg/m  05/14/23 29.74 kg/m    Lipids:  Lab Results  Component Value Date   CHOL 136 11/15/2023   CHOL 157 05/08/2023   CHOL 244 (H) 12/12/2022   Lab Results  Component Value Date   HDL 40 11/15/2023   HDL 47 05/08/2023   HDL 47 12/12/2022   Lab Results  Component Value Date   LDLCALC 79 11/15/2023   LDLCALC 92 05/08/2023   LDLCALC 167 (H) 12/12/2022   Lab Results  Component Value Date   TRIG 87 11/15/2023   TRIG 100 05/08/2023   TRIG 151 (H) 12/12/2022   Lab Results  Component Value Date   CHOLHDL 3.4 11/15/2023   CHOLHDL 3.3 05/08/2023   CHOLHDL 5.2 (H) 12/12/2022   No results found for: LDLDIRECT Glucose:  Glucose, Bld  Date Value  Ref Range Status  12/12/2022 93 65 - 99 mg/dL Final    Comment:    .            Fasting reference interval .   11/13/2021 92 65 - 99 mg/dL Final    Comment:    .            Fasting reference interval .   10/07/2019 88 65 - 99 mg/dL Final    Comment:    .            Fasting reference interval .     Flowsheet Row Office Visit from 12/18/20[facility] in [facility]  AUDIT-C Score 2      Married STD testing and prevention (HIV/chl/gon/syphilis): completed Hep C: completed  Skin cancer: Discussed monitoring for atypical lesions Colorectal cancer: does not qualify Prostate cancer: does not qualify No results found for: PSA   Lung cancer:   Low Dose CT Chest recommended if Age 69-80 years, 30 pack-year currently smoking OR have quit w/in 15years. Patient does not qualify.   AAA:  The USPSTF recommends one-time screening with  ultrasonography in men ages 21 to 53 years who have ever smoked ECG:  02/19/2023  Vaccines:  HPV: up to at age 62 , ask insurance if age between 76-45  Shingrix: 33-64 yo and ask insurance if covered when patient above 75 yo Pneumonia:  educated and discussed with patient. Flu:  educated and discussed with patient.  Advanced Care Planning: A voluntary discussion about advance care planning including the explanation and discussion of advance directives.  Discussed health care proxy and Living will, and the patient was able to identify a health care proxy as Emmalene Hasten, husband .  Patient does not have a living will at present time. If patient does have living will, I have requested they bring this to the clinic to be scanned in to their chart.  Patient Active Problem List   Diagnosis Date Noted   Mixed hyperlipidemia 03/13/2023    Past Surgical History:  Procedure Laterality Date   TYMPANOPLASTY Bilateral     Family History  Problem Relation Age of Onset   Heart disease Mother        Genetic high cholesterol   Thyroid disease Mother    Heart disease Father        High cholesterol   ADD / ADHD Maternal Grandmother    Thyroid disease Maternal Grandmother    ADD / ADHD Maternal Grandfather    Depression Maternal Grandfather    Heart disease Maternal Grandfather        Genetic high cholesterol requiring bypass at 75    Social History   Socioeconomic History   Marital status: Married    Spouse name: Emmalene   Number of children: Not on file   Years of education: Not on file   Highest education level: Bachelor's degree (e.g., BA, AB, BS)  Occupational History   Not on file  Tobacco Use   Smoking status: Never    Passive exposure: Never   Smokeless tobacco: Never  Vaping Use   Vaping status: Never Used  Substance and Sexual Activity   Alcohol use: Never   Drug use: No   Sexual activity: Yes  Other Topics Concern   Not on file  Social History Narrative   Works at  [employer] [place]   Social Drivers of Health   Financial Resource Strain: Low Risk  (12/12/2023)   Overall Financial Resource  Strain (CARDIA)    Difficulty of Paying Living Expenses: Not hard at all  Food Insecurity: No Food Insecurity (12/12/2023)   Hunger Vital Sign    Worried About Running Out of Food in the Last Year: Never true    Ran Out of Food in the Last Year: Never true  Transportation Needs: No Transportation Needs (12/12/2023)   PRAPARE - Administrator, Civil Service (Medical): No    Lack of Transportation (Non-Medical): No  Physical Activity: Sufficiently Active (12/12/2023)   Exercise Vital Sign    Days of Exercise per Week: 3 days    Minutes of Exercise per Session: 50 min  Stress: Stress Concern Present (12/12/2023)   [employer] of Occupational Health - Occupational Stress Questionnaire    Feeling of Stress: To some extent  Social Connections: Moderately Isolated (12/12/2023)   Social Connection and Isolation Panel    Frequency of Communication with Friends and Family: More than three times a week    Frequency of Social Gatherings with Friends and Family: Twice a week    Attends Religious Services: Never    Database administrator or Organizations: No    Attends Engineer, structural: Not on file    Marital Status: Married  Catering manager Violence: Not At Risk (12/12/2022)   Humiliation, Afraid, Rape, and Kick questionnaire    Fear of Current or Ex-Partner: No    Emotionally Abused: No    Physically Abused: No    Sexually Abused: No     Current Outpatient Medications:    atorvastatin  (LIPITOR) 20 MG tablet, Take 1 tablet (20 mg total) by mouth daily., Disp: 90 tablet, Rfl: 3   Multiple Vitamin (MULTI-VITAMINS) TABS, Take by mouth., Disp: , Rfl:   Allergies  Allergen Reactions   Sulfa Antibiotics Hives     ROS  Constitutional: Negative for fever or weight change.  Respiratory: Negative for cough and shortness of breath.    Cardiovascular: Negative for chest pain or palpitations.  Gastrointestinal: Negative for abdominal pain, no bowel changes.  Musculoskeletal: Negative for gait problem or joint swelling.  Skin: Negative for rash.  Neurological: Negative for dizziness or headache.  No other specific complaints in a complete review of systems (except as listed in HPI above).    Objective  Vitals:   12/18/23 0754  BP: 118/72  Pulse: 87  Resp: 16  Temp: 98 F (36.7 C)  TempSrc: Oral  SpO2: 99%  Weight: 201 lb 8 oz (91.4 kg)  Height: 5' 9 (1.753 m)    Body mass index is 29.76 kg/m.  Physical Exam Vitals reviewed.  Constitutional:      Appearance: Normal appearance.  HENT:     Head: Normocephalic.     Right Ear: Tympanic membrane normal.     Left Ear: Tympanic membrane normal.     Nose: Nose normal.  Eyes:     Extraocular Movements: Extraocular movements intact.     Conjunctiva/sclera: Conjunctivae normal.     Pupils: Pupils are equal, round, and reactive to light.  Neck:     Thyroid: No thyroid mass, thyromegaly or thyroid tenderness.  Cardiovascular:     Rate and Rhythm: Normal rate and regular rhythm.     Pulses: Normal pulses.     Heart sounds: Normal heart sounds.  Pulmonary:     Effort: Pulmonary effort is normal.     Breath sounds: Normal breath sounds.  Abdominal:     General: Bowel sounds are normal.  Palpations: Abdomen is soft.  Musculoskeletal:        General: Normal range of motion.     Cervical back: Normal range of motion and neck supple.     Right lower leg: No edema.     Left lower leg: No edema.  Skin:    General: Skin is warm and dry.     Capillary Refill: Capillary refill takes less than 2 seconds.  Neurological:     General: No focal deficit present.     Mental Status: He is alert and oriented to person, place, and time. Mental status is at baseline.  Psychiatric:        Mood and Affect: Mood normal.        Behavior: Behavior normal.        Thought  Content: Thought content normal.        Judgment: Judgment normal.      Recent Results (from the past 2160 hours)  Lipid panel     Status: None   Collection Time: 11/15/23  8:19 AM  Result Value Ref Range   Cholesterol, Total 136 100 - 199 mg/dL   Triglycerides 87 0 - 149 mg/dL   HDL 40 >60 mg/dL   VLDL Cholesterol Cal 17 5 - 40 mg/dL   LDL Chol Calc (NIH) 79 0 - 99 mg/dL   Chol/HDL Ratio 3.4 0.0 - 5.0 ratio    Comment:                                   T. Chol/HDL Ratio                                             Men  Women                               1/2 Avg.Risk  3.4    3.3                                   Avg.Risk  5.0    4.4                                2X Avg.Risk  9.6    7.1                                3X Avg.Risk 23.4   11.0   Hepatic function panel     Status: None   Collection Time: 11/15/23  8:21 AM  Result Value Ref Range   Total Protein 6.9 6.0 - 8.5 g/dL   Albumin 4.6 4.1 - 5.1 g/dL   Bilirubin Total 0.5 0.0 - 1.2 mg/dL   Bilirubin, Direct 9.83 0.00 - 0.40 mg/dL   Alkaline Phosphatase 77 44 - 121 IU/L   AST 23 0 - 40 IU/L   ALT 43 0 - 44 IU/L     Fall Risk:    12/18/2023    7:57 AM 03/13/2023    8:48 AM 01/22/2023   10:29 AM 12/12/2022    8:15 AM 11/13/2021  10:29 AM  Fall Risk   Falls in the past year? 0 0 0 0 0  Number falls in past yr: 0 0 0 0 0  Injury with Fall? 0 0 0 0 0  Risk for fall due to : No Fall Risks No Fall Risks No Fall Risks    Follow up Falls evaluation completed Falls prevention discussed Falls prevention discussed;Falls evaluation completed;Education provided  Falls evaluation completed      Data saved with a previous flowsheet row definition      Functional Status Survey: Is the patient deaf or have difficulty hearing?: No Does the patient have difficulty seeing, even when wearing glasses/contacts?: No Does the patient have difficulty concentrating, remembering, or making decisions?: No Does the patient have  difficulty walking or climbing stairs?: No Does the patient have difficulty dressing or bathing?: No Does the patient have difficulty doing errands alone such as visiting a doctor's office or shopping?: No    Assessment & Plan  Problem List Items Addressed This Visit       Other   Mixed hyperlipidemia   Other Visit Diagnoses       Encounter for annual physical exam    -  Primary   Relevant Orders   Comprehensive metabolic panel with GFR   CBC with Differential/Platelet   Hemoglobin A1c     Screening for deficiency anemia       Relevant Orders   CBC with Differential/Platelet     Screening for diabetes mellitus       Relevant Orders   Comprehensive metabolic panel with GFR   Hemoglobin A1c      Assessment and Plan Assessment & Plan Adult Wellness Visit Annual physical examination conducted. Blood pressure is optimal at 118/72 mmHg. Weight is 201 pounds with a BMI of 29.76. Depression screening is negative. Eye exam is pending scheduling. Medical clearance paperwork for adoption is being completed. - Complete medical clearance paperwork for adoption - Schedule eye exam  Hyperlipidemia Managed with atorvastatin  20 mg daily. Last lipid panel on November 15, 2023, was within normal range. Cardiologist aims for LDL cholesterol closer to 50 mg/dL due to family history and potential plaque screening with CT in the future. - Continue atorvastatin  20 mg daily - Consider future CT for plaque screening based on cardiology recommendation  Overweight BMI is 29.76, indicating overweight status. Engages in regular exercise three days a week and maintains a well-balanced diet. - Encourage continuation of regular exercise and well-balanced diet  General Health Maintenance Routine health maintenance discussed. Last A1c was 5.5 last year. No recent kidney function tests. - Order A1c and kidney function tests     -Prostate cancer screening and PSA options (with potential risks and benefits  of testing vs not testing) were discussed along with recent recs/guidelines. -USPSTF grade A and B recommendations reviewed with patient; age-appropriate recommendations, preventive care, screening tests, etc discussed and encouraged; healthy living encouraged; see AVS for patient education given to patient -Discussed importance of 150 minutes of physical activity weekly, eat two servings of fish weekly, eat one serving of tree nuts ( cashews, pistachios, pecans, almonds.SABRA) every other day, eat 6 servings of fruit/vegetables daily and drink plenty of water and avoid sweet beverages.  -Reviewed Health Maintenance: yes

## 2023-12-19 ENCOUNTER — Ambulatory Visit: Payer: Self-pay | Admitting: Nurse Practitioner

## 2023-12-19 LAB — CBC WITH DIFFERENTIAL/PLATELET
Absolute Lymphocytes: 1807 {cells}/uL (ref 850–3900)
Absolute Monocytes: 579 {cells}/uL (ref 200–950)
Basophils Absolute: 39 {cells}/uL (ref 0–200)
Basophils Relative: 0.6 %
Eosinophils Absolute: 98 {cells}/uL (ref 15–500)
Eosinophils Relative: 1.5 %
HCT: 50.8 % — ABNORMAL HIGH (ref 38.5–50.0)
Hemoglobin: 16.9 g/dL (ref 13.2–17.1)
MCH: 29.6 pg (ref 27.0–33.0)
MCHC: 33.3 g/dL (ref 32.0–36.0)
MCV: 89 fL (ref 80.0–100.0)
MPV: 9 fL (ref 7.5–12.5)
Monocytes Relative: 8.9 %
Neutro Abs: 3978 {cells}/uL (ref 1500–7800)
Neutrophils Relative %: 61.2 %
Platelets: 323 Thousand/uL (ref 140–400)
RBC: 5.71 Million/uL (ref 4.20–5.80)
RDW: 12.4 % (ref 11.0–15.0)
Total Lymphocyte: 27.8 %
WBC: 6.5 Thousand/uL (ref 3.8–10.8)

## 2023-12-19 LAB — COMPREHENSIVE METABOLIC PANEL WITH GFR
AG Ratio: 1.8 (calc) (ref 1.0–2.5)
ALT: 53 U/L — ABNORMAL HIGH (ref 9–46)
AST: 23 U/L (ref 10–40)
Albumin: 4.8 g/dL (ref 3.6–5.1)
Alkaline phosphatase (APISO): 71 U/L (ref 36–130)
BUN: 18 mg/dL (ref 7–25)
CO2: 28 mmol/L (ref 20–32)
Calcium: 10.2 mg/dL (ref 8.6–10.3)
Chloride: 103 mmol/L (ref 98–110)
Creat: 1.18 mg/dL (ref 0.60–1.26)
Globulin: 2.6 g/dL (ref 1.9–3.7)
Glucose, Bld: 94 mg/dL (ref 65–99)
Potassium: 4.6 mmol/L (ref 3.5–5.3)
Sodium: 139 mmol/L (ref 135–146)
Total Bilirubin: 0.6 mg/dL (ref 0.2–1.2)
Total Protein: 7.4 g/dL (ref 6.1–8.1)
eGFR: 84 mL/min/1.73m2 (ref >=60)

## 2023-12-19 LAB — HEMOGLOBIN A1C
Hgb A1c MFr Bld: 5.5 % (ref <5.7)
Mean Plasma Glucose: 111 mg/dL
eAG (mmol/L): 6.2 mmol/L

## 2024-02-24 ENCOUNTER — Encounter: Payer: Self-pay | Admitting: Radiology

## 2024-05-25 ENCOUNTER — Other Ambulatory Visit: Payer: Self-pay | Admitting: *Deleted

## 2024-05-25 ENCOUNTER — Telehealth: Payer: Self-pay | Admitting: *Deleted

## 2024-05-25 DIAGNOSIS — E782 Mixed hyperlipidemia: Secondary | ICD-10-CM

## 2024-05-25 DIAGNOSIS — Z8249 Family history of ischemic heart disease and other diseases of the circulatory system: Secondary | ICD-10-CM

## 2024-05-25 NOTE — Telephone Encounter (Signed)
 Labs due 06/13/24; 6 mo f/u due also.

## 2024-12-18 ENCOUNTER — Encounter: Admitting: Nurse Practitioner
